# Patient Record
Sex: Male | Born: 1975 | Race: Black or African American | Hispanic: No | Marital: Single | Smoking: Never smoker
Health system: Southern US, Community
[De-identification: ages and names within clinical notes are randomized; demographics above are authoritative.]

---

## 2020-12-23 ENCOUNTER — Other Ambulatory Visit: Payer: Self-pay

## 2020-12-23 ENCOUNTER — Inpatient Hospital Stay
Admission: EM | Admit: 2020-12-23 | Discharge: 2020-12-30 | DRG: 638 | Disposition: A | Discharge status: Home / Self Care | Payer: BC Managed Care – PPO | Attending: Internal Medicine | Admitting: Internal Medicine

## 2020-12-23 ENCOUNTER — Emergency Department: Payer: BC Managed Care – PPO

## 2020-12-23 DIAGNOSIS — S0100XD Unspecified open wound of scalp, subsequent encounter: Secondary | ICD-10-CM

## 2020-12-23 DIAGNOSIS — K2210 Ulcer of esophagus without bleeding: Secondary | ICD-10-CM | POA: Diagnosis present

## 2020-12-23 DIAGNOSIS — K7581 Nonalcoholic steatohepatitis (NASH): Secondary | ICD-10-CM

## 2020-12-23 DIAGNOSIS — D75838 Other thrombocytosis: Secondary | ICD-10-CM | POA: Diagnosis present

## 2020-12-23 DIAGNOSIS — D509 Iron deficiency anemia, unspecified: Secondary | ICD-10-CM | POA: Diagnosis present

## 2020-12-23 DIAGNOSIS — K2980 Duodenitis without bleeding: Secondary | ICD-10-CM | POA: Diagnosis present

## 2020-12-23 DIAGNOSIS — K449 Diaphragmatic hernia without obstruction or gangrene: Secondary | ICD-10-CM | POA: Diagnosis present

## 2020-12-23 DIAGNOSIS — L02811 Cutaneous abscess of head [any part, except face]: Secondary | ICD-10-CM | POA: Diagnosis present

## 2020-12-23 DIAGNOSIS — E876 Hypokalemia: Secondary | ICD-10-CM | POA: Diagnosis present

## 2020-12-23 DIAGNOSIS — J449 Chronic obstructive pulmonary disease, unspecified: Secondary | ICD-10-CM | POA: Diagnosis present

## 2020-12-23 DIAGNOSIS — K2970 Gastritis, unspecified, without bleeding: Secondary | ICD-10-CM | POA: Diagnosis present

## 2020-12-23 DIAGNOSIS — R109 Unspecified abdominal pain: Secondary | ICD-10-CM

## 2020-12-23 DIAGNOSIS — R1011 Right upper quadrant pain: Secondary | ICD-10-CM | POA: Diagnosis present

## 2020-12-23 DIAGNOSIS — Z20822 Contact with and (suspected) exposure to covid-19: Secondary | ICD-10-CM | POA: Diagnosis present

## 2020-12-23 DIAGNOSIS — E872 Acidosis, unspecified: Secondary | ICD-10-CM

## 2020-12-23 DIAGNOSIS — E860 Dehydration: Secondary | ICD-10-CM | POA: Diagnosis present

## 2020-12-23 DIAGNOSIS — F17200 Nicotine dependence, unspecified, uncomplicated: Secondary | ICD-10-CM | POA: Diagnosis present

## 2020-12-23 DIAGNOSIS — E1110 Type 2 diabetes mellitus with ketoacidosis without coma: Principal | ICD-10-CM | POA: Diagnosis present

## 2020-12-23 DIAGNOSIS — R1310 Dysphagia, unspecified: Secondary | ICD-10-CM

## 2020-12-23 DIAGNOSIS — K224 Dyskinesia of esophagus: Secondary | ICD-10-CM | POA: Diagnosis present

## 2020-12-23 DIAGNOSIS — R079 Chest pain, unspecified: Secondary | ICD-10-CM

## 2020-12-23 DIAGNOSIS — E669 Obesity, unspecified: Secondary | ICD-10-CM | POA: Diagnosis present

## 2020-12-23 DIAGNOSIS — N179 Acute kidney failure, unspecified: Secondary | ICD-10-CM | POA: Diagnosis present

## 2020-12-23 DIAGNOSIS — Z6834 Body mass index (BMI) 34.0-34.9, adult: Secondary | ICD-10-CM

## 2020-12-23 DIAGNOSIS — J069 Acute upper respiratory infection, unspecified: Secondary | ICD-10-CM | POA: Diagnosis present

## 2020-12-23 DIAGNOSIS — K5900 Constipation, unspecified: Secondary | ICD-10-CM | POA: Diagnosis present

## 2020-12-23 DIAGNOSIS — E871 Hypo-osmolality and hyponatremia: Secondary | ICD-10-CM | POA: Diagnosis present

## 2020-12-23 DIAGNOSIS — Z79899 Other long term (current) drug therapy: Secondary | ICD-10-CM

## 2020-12-23 LAB — CBC
HCT: 42.3 % (ref 39.0–52.0)
Hemoglobin: 15.1 g/dL (ref 13.0–17.0)
MCH: 28.7 pg (ref 26.0–34.0)
MCHC: 35.7 g/dL (ref 30.0–36.0)
MCV: 80.3 fL (ref 80.0–100.0)
Platelets: 465 10*3/uL — ABNORMAL HIGH (ref 150–400)
RBC: 5.27 MIL/uL (ref 4.22–5.81)
RDW: 14.9 % (ref 11.5–15.5)
WBC: 13.3 10*3/uL — ABNORMAL HIGH (ref 4.0–10.5)
nRBC: 0 % (ref 0.0–0.2)

## 2020-12-23 LAB — LACTIC ACID, PLASMA: Lactic Acid, Venous: 2.8 mmol/L (ref 0.5–1.9)

## 2020-12-23 LAB — RESP PANEL BY RT-PCR (FLU A&B, COVID) ARPGX2
Influenza A by PCR: NEGATIVE
Influenza B by PCR: NEGATIVE
SARS Coronavirus 2 by RT PCR: NEGATIVE

## 2020-12-23 MED ORDER — SODIUM CHLORIDE 0.9 % IV BOLUS
1000.0000 mL | Freq: Once | INTRAVENOUS | Status: AC
  Administered 2020-12-23: 1000 mL via INTRAVENOUS

## 2020-12-23 MED ORDER — KETOROLAC TROMETHAMINE 30 MG/ML IJ SOLN
30.0000 mg | Freq: Once | INTRAMUSCULAR | Status: AC
  Administered 2020-12-23: 30 mg via INTRAVENOUS
  Filled 2020-12-23: qty 1

## 2020-12-23 MED ORDER — ONDANSETRON HCL 4 MG/2ML IJ SOLN
4.0000 mg | Freq: Once | INTRAMUSCULAR | Status: AC
  Administered 2020-12-23: 4 mg via INTRAVENOUS
  Filled 2020-12-23: qty 2

## 2020-12-23 NOTE — ED Provider Notes (Signed)
[facility] Emergency Department Provider Note  Time seen: 11:53 PM  I have reviewed the triage vital signs and the nursing notes.   HISTORY  Chief Complaint Shortness of Breath   HPI David Watts is a 45 y.o. male with no past medical history who presents to the emergency department for 1 week of feeling poorly.  According to the patient for the past 1 week he has been experiencing chest pain described as a burning sensation like heartburn/reflux but worse.  States he has been short of breath at times nauseated and did have vomiting earlier today.  States subjective fever but no measured temperature.  Denies any cough.  Denies abdominal pain but states constipation over the past 1 week.  Denies any dysuria.  States generalized fatigue and weakness.   No past medical history on file.  There are no problems to display for this patient.   Prior to Admission medications   Not on File    No Known Allergies  No family history on file.  Social History    Review of Systems Constitutional: Subjective fever.  Positive generalized fatigue/weakness Cardiovascular: Chest burning/discomfort intermittent x1 week Respiratory: Mild shortness of breath.  Negative for cough. Gastrointestinal: Negative for abdominal pain.  Positive for nausea vomiting.  Negative for diarrhea.  Positive for constipation. Genitourinary: Negative for urinary compaints Musculoskeletal: Negative for musculoskeletal complaints Neurological: Negative for headache All other ROS negative  ____________________________________________   PHYSICAL EXAM:  VITAL SIGNS: ED Triage Vitals  Enc Vitals Group     BP 12/23/20 2209 (!) 130/102     Pulse Rate 12/23/20 2209 90     Resp 12/23/20 2209 (!) 22     Temp 12/23/20 2209 98.4 F (36.9 C)     Temp src --      SpO2 12/23/20 2209 99 %     Weight 12/23/20 2210 250 lb (113.4 kg)     Height 12/23/20 2210 5\' 11"  (1.803 m)     Head  Circumference --      Peak Flow --      Pain Score 12/23/20 2210 6     Pain Loc --      Pain Edu? --      Excl. in GC? --    Constitutional: Alert and oriented. Well appearing and in no distress. Eyes: Normal exam ENT      Head: Normocephalic and atraumatic.      Mouth/Throat: Mucous membranes are moist. Cardiovascular: Normal rate, regular rhythm. Respiratory: Normal respiratory effort without tachypnea nor retractions. Breath sounds are clear, without wheeze rales or rhonchi on exam. Gastrointestinal: Soft and nontender. No distention.  Musculoskeletal: Nontender with normal range of motion in all extremities.  Neurologic:  Normal speech and language. No gross focal neurologic deficits  Skin:  Skin is warm, slightly pale. Psychiatric: Mood and affect are normal.   ____________________________________________    EKG  EKG viewed and interpreted by myself shows a sinus rhythm at 92 bpm with a narrow QRS, normal axis, normal intervals, nonspecific ST changes.  ____________________________________________    RADIOLOGY  Chest x-ray is negative for acute abnormality.  ____________________________________________   INITIAL IMPRESSION / ASSESSMENT AND PLAN / ED COURSE  Pertinent labs & imaging results that were available during my care of the patient were reviewed by me and considered in my medical decision making (see chart for details).   Patient presents to the emergency department for 1 week of multiple symptoms including fatigue subjective fever, shortness of  breath, chest pain/burning, nausea and constipation.  Differential is quite broad but would include infectious etiologies such as COVID, pneumonia, UTI, intra-abdominal infection although benign abdomen on exam, dehydration, ACS.  We will check labs including a COVID swab, lactic acid, troponin.  We will IV hydrate treat with Toradol Zofran and continue to closely monitor while awaiting results.  Patient agreeable to plan  of care.   Patient's labs have resulted showing hyperglycemia of 453 with pseudohyponatremia of 123.  Patient has acute kidney injury with creatinine 1.4 unable to calculate anion gap with a bicarb of less than 7 likely indicating new onset diabetes with diabetic ketoacidosis.  We will check a VBG start on insulin infusion.  Potassium is 3.2 we will order IV potassium.  We will continue with IV hydration.  Patient will require admission to the hospital service.  In speaking to the patient he states for the past several weeks he has been extremely thirsty and urinating very frequently again consistent with new onset diabetes.  David Watts was evaluated in Emergency Department on 12/23/2020 for the symptoms described in the history of present illness. He was evaluated in the context of the global COVID-19 pandemic, which necessitated consideration that the patient might be at risk for infection with the SARS-CoV-2 virus that causes COVID-19. Institutional protocols and algorithms that pertain to the evaluation of patients at risk for COVID-19 are in a state of rapid change based on information released by regulatory bodies including the CDC and federal and state organizations. These policies and algorithms were followed during the patient's care in the ED.  CRITICAL CARE Performed by: Minna Antis   Total critical care time: 30 minutes  Critical care time was exclusive of separately billable procedures and treating other patients.  Critical care was necessary to treat or prevent imminent or life-threatening deterioration.  Critical care was time spent personally by me on the following activities: development of treatment plan with patient and/or surrogate as well as nursing, discussions with consultants, evaluation of patient's response to treatment, examination of patient, obtaining history from patient or surrogate, ordering and performing treatments and interventions, ordering and review of  laboratory studies, ordering and review of radiographic studies, pulse oximetry and re-evaluation of patient's condition.  ____________________________________________   FINAL CLINICAL IMPRESSION(S) / ED DIAGNOSES  New onset diabetes Diabetic ketoacidosis   Minna Antis, MD 12/24/20 [phone]

## 2020-12-23 NOTE — ED Notes (Signed)
Water given with provider approval

## 2020-12-23 NOTE — ED Triage Notes (Addendum)
Pt states has been nauseated, shob, sore throiat and chest pain for most of this week. Pt states was seen at urgent care for same and had a negative covid test. Pt states has had fever. Pt states he feels weak and has had decreased po intake over last week. Pt with marked shob with any exertion.

## 2020-12-23 NOTE — ED Notes (Addendum)
Ex blue, type/screen sent. Pt and family report appears paler than normal, pt markedly weak with shob while speaking

## 2020-12-24 ENCOUNTER — Inpatient Hospital Stay: Payer: BC Managed Care – PPO

## 2020-12-24 ENCOUNTER — Encounter: Payer: Self-pay | Admitting: Internal Medicine

## 2020-12-24 DIAGNOSIS — L02811 Cutaneous abscess of head [any part, except face]: Secondary | ICD-10-CM | POA: Diagnosis present

## 2020-12-24 DIAGNOSIS — J449 Chronic obstructive pulmonary disease, unspecified: Secondary | ICD-10-CM | POA: Diagnosis present

## 2020-12-24 DIAGNOSIS — E872 Acidosis, unspecified: Secondary | ICD-10-CM

## 2020-12-24 DIAGNOSIS — K2970 Gastritis, unspecified, without bleeding: Secondary | ICD-10-CM | POA: Diagnosis present

## 2020-12-24 DIAGNOSIS — K2210 Ulcer of esophagus without bleeding: Secondary | ICD-10-CM | POA: Diagnosis present

## 2020-12-24 DIAGNOSIS — R1319 Other dysphagia: Secondary | ICD-10-CM | POA: Diagnosis not present

## 2020-12-24 DIAGNOSIS — J069 Acute upper respiratory infection, unspecified: Secondary | ICD-10-CM

## 2020-12-24 DIAGNOSIS — R1011 Right upper quadrant pain: Secondary | ICD-10-CM | POA: Diagnosis present

## 2020-12-24 DIAGNOSIS — E669 Obesity, unspecified: Secondary | ICD-10-CM | POA: Diagnosis present

## 2020-12-24 DIAGNOSIS — D509 Iron deficiency anemia, unspecified: Secondary | ICD-10-CM | POA: Diagnosis present

## 2020-12-24 DIAGNOSIS — K2980 Duodenitis without bleeding: Secondary | ICD-10-CM | POA: Diagnosis present

## 2020-12-24 DIAGNOSIS — N179 Acute kidney failure, unspecified: Secondary | ICD-10-CM

## 2020-12-24 DIAGNOSIS — D75838 Other thrombocytosis: Secondary | ICD-10-CM | POA: Diagnosis present

## 2020-12-24 DIAGNOSIS — K449 Diaphragmatic hernia without obstruction or gangrene: Secondary | ICD-10-CM | POA: Diagnosis present

## 2020-12-24 DIAGNOSIS — E871 Hypo-osmolality and hyponatremia: Secondary | ICD-10-CM | POA: Diagnosis present

## 2020-12-24 DIAGNOSIS — I5031 Acute diastolic (congestive) heart failure: Secondary | ICD-10-CM | POA: Diagnosis not present

## 2020-12-24 DIAGNOSIS — R933 Abnormal findings on diagnostic imaging of other parts of digestive tract: Secondary | ICD-10-CM | POA: Diagnosis not present

## 2020-12-24 DIAGNOSIS — K5900 Constipation, unspecified: Secondary | ICD-10-CM | POA: Diagnosis present

## 2020-12-24 DIAGNOSIS — Z79899 Other long term (current) drug therapy: Secondary | ICD-10-CM | POA: Diagnosis not present

## 2020-12-24 DIAGNOSIS — K7581 Nonalcoholic steatohepatitis (NASH): Secondary | ICD-10-CM | POA: Diagnosis present

## 2020-12-24 DIAGNOSIS — K224 Dyskinesia of esophagus: Secondary | ICD-10-CM | POA: Diagnosis present

## 2020-12-24 DIAGNOSIS — Z6834 Body mass index (BMI) 34.0-34.9, adult: Secondary | ICD-10-CM | POA: Diagnosis not present

## 2020-12-24 DIAGNOSIS — Z20822 Contact with and (suspected) exposure to covid-19: Secondary | ICD-10-CM | POA: Diagnosis present

## 2020-12-24 DIAGNOSIS — E876 Hypokalemia: Secondary | ICD-10-CM | POA: Diagnosis present

## 2020-12-24 DIAGNOSIS — E1110 Type 2 diabetes mellitus with ketoacidosis without coma: Principal | ICD-10-CM

## 2020-12-24 DIAGNOSIS — F17200 Nicotine dependence, unspecified, uncomplicated: Secondary | ICD-10-CM | POA: Diagnosis present

## 2020-12-24 DIAGNOSIS — E860 Dehydration: Secondary | ICD-10-CM | POA: Diagnosis present

## 2020-12-24 DIAGNOSIS — R1310 Dysphagia, unspecified: Secondary | ICD-10-CM | POA: Diagnosis not present

## 2020-12-24 LAB — BLOOD GAS, VENOUS
Acid-base deficit: 20.3 mmol/L — ABNORMAL HIGH (ref 0.0–2.0)
Bicarbonate: 7 mmol/L — ABNORMAL LOW (ref 20.0–28.0)
O2 Saturation: 53.5 %
Patient temperature: 37
pCO2, Ven: 21 mmHg — ABNORMAL LOW (ref 44.0–60.0)
pH, Ven: 7.13 — CL (ref 7.250–7.430)
pO2, Ven: 39 mmHg (ref 32.0–45.0)

## 2020-12-24 LAB — BASIC METABOLIC PANEL
Anion gap: 18 — ABNORMAL HIGH (ref 5–15)
Anion gap: 17 — ABNORMAL HIGH (ref 5–15)
Anion gap: 8 (ref 5–15)
Anion gap: 11 (ref 5–15)
Anion gap: 6 (ref 5–15)
Anion gap: 10 (ref 5–15)
BUN: 25 mg/dL — ABNORMAL HIGH (ref 6–20)
BUN: 24 mg/dL — ABNORMAL HIGH (ref 6–20)
BUN: 20 mg/dL (ref 6–20)
BUN: 21 mg/dL — ABNORMAL HIGH (ref 6–20)
BUN: 17 mg/dL (ref 6–20)
BUN: 14 mg/dL (ref 6–20)
CO2: 10 mmol/L — ABNORMAL LOW (ref 22–32)
CO2: 9 mmol/L — ABNORMAL LOW (ref 22–32)
CO2: 12 mmol/L — ABNORMAL LOW (ref 22–32)
CO2: 14 mmol/L — ABNORMAL LOW (ref 22–32)
CO2: 14 mmol/L — ABNORMAL LOW (ref 22–32)
CO2: 10 mmol/L — ABNORMAL LOW (ref 22–32)
Calcium: 10 mg/dL (ref 8.9–10.3)
Calcium: 9.8 mg/dL (ref 8.9–10.3)
Calcium: 9.4 mg/dL (ref 8.9–10.3)
Calcium: 9.6 mg/dL (ref 8.9–10.3)
Calcium: 9.7 mg/dL (ref 8.9–10.3)
Calcium: 9.5 mg/dL (ref 8.9–10.3)
Chloride: 97 mmol/L — ABNORMAL LOW (ref 98–111)
Chloride: 101 mmol/L (ref 98–111)
Chloride: 104 mmol/L (ref 98–111)
Chloride: 99 mmol/L (ref 98–111)
Chloride: 106 mmol/L (ref 98–111)
Chloride: 106 mmol/L (ref 98–111)
Creatinine, Ser: 1.58 mg/dL — ABNORMAL HIGH (ref 0.61–1.24)
Creatinine, Ser: 1.28 mg/dL — ABNORMAL HIGH (ref 0.61–1.24)
Creatinine, Ser: 1.24 mg/dL (ref 0.61–1.24)
Creatinine, Ser: 1.55 mg/dL — ABNORMAL HIGH (ref 0.61–1.24)
Creatinine, Ser: 1.25 mg/dL — ABNORMAL HIGH (ref 0.61–1.24)
Creatinine, Ser: 1.37 mg/dL — ABNORMAL HIGH (ref 0.61–1.24)
GFR, Estimated: 55 mL/min — ABNORMAL LOW (ref >60)
GFR, Estimated: >60 mL/min (ref >60)
GFR, Estimated: >60 mL/min (ref >60)
GFR, Estimated: 57 mL/min — ABNORMAL LOW (ref >60)
GFR, Estimated: >60 mL/min (ref >60)
GFR, Estimated: >60 mL/min (ref >60)
Glucose, Bld: 433 mg/dL — ABNORMAL HIGH (ref 70–99)
Glucose, Bld: 252 mg/dL — ABNORMAL HIGH (ref 70–99)
Glucose, Bld: 225 mg/dL — ABNORMAL HIGH (ref 70–99)
Glucose, Bld: 220 mg/dL — ABNORMAL HIGH (ref 70–99)
Glucose, Bld: 153 mg/dL — ABNORMAL HIGH (ref 70–99)
Glucose, Bld: 181 mg/dL — ABNORMAL HIGH (ref 70–99)
Potassium: 2.9 mmol/L — ABNORMAL LOW (ref 3.5–5.1)
Potassium: 2.3 mmol/L — CL (ref 3.5–5.1)
Potassium: 2.3 mmol/L — CL (ref 3.5–5.1)
Potassium: 2.9 mmol/L — ABNORMAL LOW (ref 3.5–5.1)
Potassium: 2.7 mmol/L — CL (ref 3.5–5.1)
Potassium: 3.1 mmol/L — ABNORMAL LOW (ref 3.5–5.1)
Sodium: 125 mmol/L — ABNORMAL LOW (ref 135–145)
Sodium: 127 mmol/L — ABNORMAL LOW (ref 135–145)
Sodium: 124 mmol/L — ABNORMAL LOW (ref 135–145)
Sodium: 124 mmol/L — ABNORMAL LOW (ref 135–145)
Sodium: 126 mmol/L — ABNORMAL LOW (ref 135–145)
Sodium: 126 mmol/L — ABNORMAL LOW (ref 135–145)

## 2020-12-24 LAB — COMPREHENSIVE METABOLIC PANEL
ALT: 24 U/L (ref 0–44)
AST: 30 U/L (ref 15–41)
Albumin: 2.7 g/dL — ABNORMAL LOW (ref 3.5–5.0)
Alkaline Phosphatase: 114 U/L (ref 38–126)
BUN: 22 mg/dL — ABNORMAL HIGH (ref 6–20)
CO2: <7 mmol/L — ABNORMAL LOW (ref 22–32)
Calcium: 9.8 mg/dL (ref 8.9–10.3)
Chloride: 97 mmol/L — ABNORMAL LOW (ref 98–111)
Creatinine, Ser: 1.4 mg/dL — ABNORMAL HIGH (ref 0.61–1.24)
GFR, Estimated: >60 mL/min (ref >60)
Glucose, Bld: 453 mg/dL — ABNORMAL HIGH (ref 70–99)
Potassium: 3.2 mmol/L — ABNORMAL LOW (ref 3.5–5.1)
Sodium: 123 mmol/L — ABNORMAL LOW (ref 135–145)
Total Bilirubin: 2.1 mg/dL — ABNORMAL HIGH (ref 0.3–1.2)
Total Protein: 7 g/dL (ref 6.5–8.1)

## 2020-12-24 LAB — URINALYSIS, COMPLETE (UACMP) WITH MICROSCOPIC
Bilirubin Urine: NEGATIVE
Glucose, UA: >=500 mg/dL — AB
Ketones, ur: 80 mg/dL — AB
Leukocytes,Ua: NEGATIVE
Nitrite: NEGATIVE
Protein, ur: 100 mg/dL — AB
Specific Gravity, Urine: 1.024 (ref 1.005–1.030)
pH: 6 (ref 5.0–8.0)

## 2020-12-24 LAB — GLUCOSE, CAPILLARY
Glucose-Capillary: 175 mg/dL — ABNORMAL HIGH (ref 70–99)
Glucose-Capillary: 178 mg/dL — ABNORMAL HIGH (ref 70–99)
Glucose-Capillary: 186 mg/dL — ABNORMAL HIGH (ref 70–99)
Glucose-Capillary: 187 mg/dL — ABNORMAL HIGH (ref 70–99)
Glucose-Capillary: 220 mg/dL — ABNORMAL HIGH (ref 70–99)

## 2020-12-24 LAB — CBG MONITORING, ED
Glucose-Capillary: 423 mg/dL — ABNORMAL HIGH (ref 70–99)
Glucose-Capillary: 389 mg/dL — ABNORMAL HIGH (ref 70–99)
Glucose-Capillary: 320 mg/dL — ABNORMAL HIGH (ref 70–99)
Glucose-Capillary: 255 mg/dL — ABNORMAL HIGH (ref 70–99)
Glucose-Capillary: 239 mg/dL — ABNORMAL HIGH (ref 70–99)
Glucose-Capillary: 284 mg/dL — ABNORMAL HIGH (ref 70–99)
Glucose-Capillary: 247 mg/dL — ABNORMAL HIGH (ref 70–99)
Glucose-Capillary: 233 mg/dL — ABNORMAL HIGH (ref 70–99)
Glucose-Capillary: 223 mg/dL — ABNORMAL HIGH (ref 70–99)
Glucose-Capillary: 177 mg/dL — ABNORMAL HIGH (ref 70–99)
Glucose-Capillary: 244 mg/dL — ABNORMAL HIGH (ref 70–99)
Glucose-Capillary: 216 mg/dL — ABNORMAL HIGH (ref 70–99)
Glucose-Capillary: 181 mg/dL — ABNORMAL HIGH (ref 70–99)
Glucose-Capillary: 151 mg/dL — ABNORMAL HIGH (ref 70–99)

## 2020-12-24 LAB — MAGNESIUM: Magnesium: 2 mg/dL (ref 1.7–2.4)

## 2020-12-24 LAB — TROPONIN I (HIGH SENSITIVITY)
Troponin I (High Sensitivity): 8 ng/L (ref <18)
Troponin I (High Sensitivity): 19 ng/L — ABNORMAL HIGH (ref <18)

## 2020-12-24 LAB — HEMOGLOBIN A1C
Hgb A1c MFr Bld: 14.2 % — ABNORMAL HIGH (ref 4.8–5.6)
Mean Plasma Glucose: 360.84 mg/dL

## 2020-12-24 LAB — BETA-HYDROXYBUTYRIC ACID
Beta-Hydroxybutyric Acid: 6.08 mmol/L — ABNORMAL HIGH (ref 0.05–0.27)
Beta-Hydroxybutyric Acid: 2.93 mmol/L — ABNORMAL HIGH (ref 0.05–0.27)
Beta-Hydroxybutyric Acid: 2.81 mmol/L — ABNORMAL HIGH (ref 0.05–0.27)

## 2020-12-24 LAB — LIPASE, BLOOD: Lipase: 199 U/L — ABNORMAL HIGH (ref 11–51)

## 2020-12-24 LAB — LACTIC ACID, PLASMA: Lactic Acid, Venous: 2.2 mmol/L (ref 0.5–1.9)

## 2020-12-24 LAB — MRSA NEXT GEN BY PCR, NASAL: MRSA by PCR Next Gen: NOT DETECTED

## 2020-12-24 LAB — HIV ANTIBODY (ROUTINE TESTING W REFLEX): HIV Screen 4th Generation wRfx: NONREACTIVE

## 2020-12-24 MED ORDER — PANTOPRAZOLE SODIUM 40 MG IV SOLR
40.0000 mg | INTRAVENOUS | Status: DC
  Administered 2020-12-24 – 2020-12-26 (×3): 40 mg via INTRAVENOUS
  Filled 2020-12-24 (×3): qty 40

## 2020-12-24 MED ORDER — DEXTROSE IN LACTATED RINGERS 5 % IV SOLN: INTRAVENOUS | Status: DC

## 2020-12-24 MED ORDER — POTASSIUM CHLORIDE 10 MEQ/100ML IV SOLN: 10.0000 meq | INTRAVENOUS | Status: DC

## 2020-12-24 MED ORDER — POTASSIUM CHLORIDE 10 MEQ/100ML IV SOLN
10.0000 meq | INTRAVENOUS | Status: AC
  Administered 2020-12-24 (×5): 10 meq via INTRAVENOUS
  Filled 2020-12-24 (×4): qty 100

## 2020-12-24 MED ORDER — POTASSIUM CHLORIDE 10 MEQ/100ML IV SOLN
10.0000 meq | INTRAVENOUS | Status: AC
  Administered 2020-12-24 (×4): 10 meq via INTRAVENOUS
  Filled 2020-12-24 (×4): qty 100

## 2020-12-24 MED ORDER — POTASSIUM CHLORIDE 10 MEQ/100ML IV SOLN
INTRAVENOUS | Status: AC
  Administered 2020-12-24: 10 meq via INTRAVENOUS
  Filled 2020-12-24: qty 100

## 2020-12-24 MED ORDER — ENOXAPARIN SODIUM 60 MG/0.6ML IJ SOSY
0.5000 mg/kg | PREFILLED_SYRINGE | INTRAMUSCULAR | Status: DC
  Administered 2020-12-24 – 2020-12-30 (×7): 57.5 mg via SUBCUTANEOUS
  Filled 2020-12-24: qty 0.6
  Filled 2020-12-24: qty 0.57
  Filled 2020-12-24 (×4): qty 0.6
  Filled 2020-12-24: qty 0.57

## 2020-12-24 MED ORDER — CHLORHEXIDINE GLUCONATE CLOTH 2 % EX PADS
6.0000 | MEDICATED_PAD | Freq: Every day | CUTANEOUS | Status: DC
  Administered 2020-12-24 – 2020-12-29 (×6): 6 via TOPICAL

## 2020-12-24 MED ORDER — POTASSIUM CHLORIDE 10 MEQ/100ML IV SOLN
10.0000 meq | INTRAVENOUS | Status: AC
  Administered 2020-12-24 (×2): 10 meq via INTRAVENOUS
  Filled 2020-12-24: qty 100

## 2020-12-24 MED ORDER — INSULIN STARTER KIT- PEN NEEDLES (ENGLISH)
1.0000 | Freq: Once | Status: AC
  Administered 2020-12-24: 1
  Filled 2020-12-24: qty 1

## 2020-12-24 MED ORDER — DEXTROSE 50 % IV SOLN: 0.0000 mL | INTRAVENOUS | Status: DC | PRN

## 2020-12-24 MED ORDER — OXYCODONE-ACETAMINOPHEN 5-325 MG PO TABS
1.0000 | ORAL_TABLET | ORAL | Status: DC | PRN
  Administered 2020-12-24 – 2020-12-25 (×4): 1 via ORAL
  Filled 2020-12-24 (×4): qty 1

## 2020-12-24 MED ORDER — PNEUMOCOCCAL VAC POLYVALENT 25 MCG/0.5ML IJ INJ
0.5000 mL | INJECTION | INTRAMUSCULAR | Status: DC
  Filled 2020-12-24: qty 0.5

## 2020-12-24 MED ORDER — LIVING WELL WITH DIABETES BOOK
Freq: Once | Status: AC
  Filled 2020-12-24: qty 1

## 2020-12-24 MED ORDER — DOXYCYCLINE HYCLATE 100 MG PO TABS
100.0000 mg | ORAL_TABLET | Freq: Two times a day (BID) | ORAL | Status: DC
  Administered 2020-12-24 – 2020-12-30 (×13): 100 mg via ORAL
  Filled 2020-12-24 (×13): qty 1

## 2020-12-24 MED ORDER — SODIUM CHLORIDE 0.9 % IV BOLUS
1000.0000 mL | Freq: Once | INTRAVENOUS | Status: AC
  Administered 2020-12-24: 1000 mL via INTRAVENOUS

## 2020-12-24 MED ORDER — INSULIN REGULAR(HUMAN) IN NACL 100-0.9 UT/100ML-% IV SOLN
INTRAVENOUS | Status: DC
  Filled 2020-12-24: qty 100

## 2020-12-24 MED ORDER — LACTATED RINGERS IV SOLN: INTRAVENOUS | Status: DC

## 2020-12-24 MED ORDER — INSULIN REGULAR(HUMAN) IN NACL 100-0.9 UT/100ML-% IV SOLN
INTRAVENOUS | Status: AC
Start: 2020-12-24 — End: 2020-12-26
  Administered 2020-12-24: 6.5 [IU]/h via INTRAVENOUS
  Administered 2020-12-24: 3.2 [IU]/h via INTRAVENOUS
  Administered 2020-12-25: 5.5 [IU]/h via INTRAVENOUS
  Administered 2020-12-26: 11 [IU]/h via INTRAVENOUS
  Filled 2020-12-24 (×3): qty 100

## 2020-12-24 MED ORDER — AMOXICILLIN-POT CLAVULANATE 875-125 MG PO TABS
1.0000 | ORAL_TABLET | Freq: Two times a day (BID) | ORAL | Status: DC
  Administered 2020-12-24 – 2020-12-30 (×13): 1 via ORAL
  Filled 2020-12-24 (×14): qty 1

## 2020-12-24 MED ORDER — LIDOCAINE-EPINEPHRINE 2 %-1:100000 IJ SOLN
20.0000 mL | Freq: Once | INTRAMUSCULAR | Status: AC
  Administered 2020-12-24: 20 mL via INTRADERMAL

## 2020-12-24 MED ORDER — KCL IN DEXTROSE-NACL 20-5-0.9 MEQ/L-%-% IV SOLN
INTRAVENOUS | Status: DC
  Filled 2020-12-24 (×4): qty 1000

## 2020-12-24 MED ORDER — POTASSIUM CHLORIDE IN NACL 20-0.9 MEQ/L-% IV SOLN
INTRAVENOUS | Status: DC
  Filled 2020-12-24 (×5): qty 1000

## 2020-12-24 NOTE — ED Notes (Signed)
Lab called with critical potassium of 2.3.  MD notified.

## 2020-12-24 NOTE — ED Notes (Signed)
Admitting MD at bedside.

## 2020-12-24 NOTE — ED Notes (Signed)
Pt lying in bed asleep. Pt vitals stable at this time with pt not presenting with any obvious distress. Pt awaiting next blood draw to assess plan of care.

## 2020-12-24 NOTE — Procedures (Addendum)
Procedure Note  Date: 12/24/20 1:44 PM  Preforming Provider: Lynden Oxford, PA-C  Procedure: Incisions and Drainage  Pre-Procedure Diagnosis: Infected Occiput Scalp Cyst   Post-Procedure Diagnosis: Same  Anesthesia: 7 ccs of 1% lidocaine with epinephrine  Findings: Purulent drainage which was Cx, Cyst wall   Details of Procedure:  All risks, benefits, and alternatives to above procedure(s) were discussed with the patient and informed consent was obtained. The patient's occiput was prepped and draped in standard sterile fashion. 7 ccs of 1% lidocaine with epinephrine with injected intradermally and adequate anesthesia achieved. Using an 11 blade scalpel, a approximately 2 cm elliptical incision was made over already draining areas of purulence. This was Cx. I was able to sharply excise a majority of the cyst wall however, this did appear to have already ruptured. Forceps were used to probe the wound and break up loculation. The wound was then irrigated with copious amount of NS and packed with 1/2 inch iodoform gauze. The patient tolerated this well without immediate complications. All sharps were accounted for and disposed of properly  Complications: None apparent  --  Lynden Oxford, PA-C [facility] 12/24/2020, 1:44 PM [phone] M-F: 7am - 4pm  I was immediately available for supervision.

## 2020-12-24 NOTE — Progress Notes (Signed)
Received report and assumed pt care. Patient currently on first K-Rider out of 4 ordered. Potassium reportedly 2.3. Insulin gtt already infusing at time of handoff report. Changed patient to NPO except meds and sips per Dr. Para March for antibiotics and pain management. No serial BMPs ordered. Order placed for 2200. Will continue to monitor.

## 2020-12-24 NOTE — H&P (Signed)
History and Physical    David Watts HEN:[id number] DOB: 02/11/1976 DOA: 12/23/2020  PCP: Pcp, No   Patient coming from: home  I have personally briefly reviewed patient's old medical records in [facility]  Chief Complaint: Generalized malaise  HPI: David Watts is a 45 y.o. male with No significant past medical history who presents to the ED with a 1 week history of generalized malaise, epigastric and chest pain as well as nonbloody nonbilious vomiting.  Denies cough or shortness of breath.  Endorses urinary frequency and thirst patient visited an urgent care earlier in the week where he tested negative for COVID.  ED course: Temperature 98.4, BP 130/102 with pulse of 90 and respirations 22 with O2 sat 99% on room air Blood work with blood glucose of 453, anion gap not calculable due to bicarb under 7.  Potassium 3.2, creatinine 1.4, sodium 123 Lactic acid 2.8, WBC 13,000 with normal hemoglobin.  Troponin of 8.  COVID and flu negative.  Beta hydroxybutyric acid and VBG pending  EKG, personally viewed and interpreted: Sinus at 92 with no acute ST-T wave changes  Imaging: Chest x-ray no active disease  Patient given an IV fluid bolus, started on potassium replacement and IV insulin.  Hospitalist consulted for admission.   Review of Systems: As per HPI otherwise all other systems on review of systems negative.    History reviewed. No pertinent past medical history.  History reviewed. No pertinent surgical history.   reports that he has never smoked. He has never used smokeless tobacco. He reports that he does not drink alcohol. No history on file for drug use.  No Known Allergies  History reviewed. No pertinent family history.    Prior to Admission medications   Not on File    Physical Exam: Vitals:   12/23/20 2219 12/23/20 2230 12/23/20 2300 12/23/20 2349  BP:  (!) 146/86 137/79 113/83  Pulse:  84 81 79  Resp: (!) 28 (!) 28 (!) 23 (!) 25  Temp:      SpO2:  99%  100% 99%  Weight:      Height:         Vitals:   12/23/20 2219 12/23/20 2230 12/23/20 2300 12/23/20 2349  BP:  (!) 146/86 137/79 113/83  Pulse:  84 81 79  Resp: (!) 28 (!) 28 (!) 23 (!) 25  Temp:      SpO2:  99% 100% 99%  Weight:      Height:          Constitutional: Ill-appearing, dehydrated, eyes sunken .oriented x 3 . Not in any apparent distress HEENT:      Head: Normocephalic and atraumatic.         Eyes: PERLA, EOMI, Conjunctivae are normal. Sclera is non-icteric.       Mouth/Throat: Mucous membranes are dry      Neck: Supple with no signs of meningismus. Cardiovascular: Regular rate and rhythm. No murmurs, gallops, or rubs. 2+ symmetrical distal pulses are present . No JVD. No LE edema Respiratory: Respiratory effort normal .Lungs sounds clear bilaterally. No wheezes, crackles, or rhonchi.  Gastrointestinal: Soft, non tender, and non distended with positive bowel sounds.  Genitourinary: No CVA tenderness. Musculoskeletal: Nontender with normal range of motion in all extremities. No cyanosis, or erythema of extremities. Neurologic:  Face is symmetric. Moving all extremities. No gross focal neurologic deficits . Skin: Skin is warm, dry.  No rash or ulcers Psychiatric: Mood and affect are normal    Labs  on Admission: I have personally reviewed following labs and imaging studies  CBC: Recent Labs  Lab 12/23/20 2251  WBC 13.3*  HGB 15.1  HCT 42.3  MCV 80.3  PLT 465*   Basic Metabolic Panel: Recent Labs  Lab 12/23/20 2251  NA 123*  K 3.2*  CL 97*  CO2 <7*  GLUCOSE 453*  BUN 22*  CREATININE 1.40*  CALCIUM 9.8   GFR: Estimated Creatinine Clearance: 87.1 mL/min (A) (by C-G formula based on SCr of 1.4 mg/dL (H)). Liver Function Tests: Recent Labs  Lab 12/23/20 2251  AST 30  ALT 24  ALKPHOS 114  BILITOT 2.1*  PROT 7.0  ALBUMIN 2.7*   No results for input(s): LIPASE, AMYLASE in the last 168 hours. No results for input(s): AMMONIA in the last 168  hours. Coagulation Profile: No results for input(s): INR, PROTIME in the last 168 hours. Cardiac Enzymes: No results for input(s): CKTOTAL, CKMB, CKMBINDEX, TROPONINI in the last 168 hours. BNP (last 3 results) No results for input(s): PROBNP in the last 8760 hours. HbA1C: No results for input(s): HGBA1C in the last 72 hours. CBG: No results for input(s): GLUCAP in the last 168 hours. Lipid Profile: No results for input(s): CHOL, HDL, LDLCALC, TRIG, CHOLHDL, LDLDIRECT in the last 72 hours. Thyroid Function Tests: No results for input(s): TSH, T4TOTAL, FREET4, T3FREE, THYROIDAB in the last 72 hours. Anemia Panel: No results for input(s): VITAMINB12, FOLATE, FERRITIN, TIBC, IRON, RETICCTPCT in the last 72 hours. Urine analysis: No results found for: COLORURINE, APPEARANCEUR, LABSPEC, PHURINE, GLUCOSEU, HGBUR, BILIRUBINUR, KETONESUR, PROTEINUR, UROBILINOGEN, NITRITE, LEUKOCYTESUR  Radiological Exams on Admission: DG Chest Port 1 View  Result Date: 12/23/2020 CLINICAL DATA:  Nausea short of breath EXAM: PORTABLE CHEST 1 VIEW COMPARISON:  None. FINDINGS: The heart size and mediastinal contours are within normal limits. Both lungs are clear. The visualized skeletal structures are unremarkable. IMPRESSION: No active disease. Electronically Signed   By: Jasmine Pang M.D.   On: 12/23/2020 22:46     Assessment/Plan 45 year old male with no significant past medical history presenting with a week of generalized vomiting, weakness, increased thirst and urinary frequency     DKA (diabetic ketoacidosis), new onset -Blood glucose 453 with anion gap not calculable due to bicarb in the 7 - Follow beta hydroxybutyric acid and VBG - Continue IV fluid resuscitation, insulin infusion and potassium supplementation per Endo tool - Troponin negative follow urinalysis, lipase - Diabetic educator and dietary consult    AKI (acute kidney injury) (HCC) - Likely related to DKA - Monitor for improvement with  IV fluids    URI (upper respiratory infection) - COVID-negative and chest x-ray clear - Treat symptoms    Lactic acidosis - Likely related to DKA.  Sepsis not suspected at this time.       DVT prophylaxis: Lovenox  Code Status: full code  Family Communication:  none  Disposition Plan: Back to previous home environment Consults called: none  Status:At the time of admission, it appears that the appropriate admission status for this patient is INPATIENT. This is judged to be reasonable and necessary in order to provide the required intensity of service to ensure the patient's safety given the presenting symptoms, physical exam findings, and initial radiographic and laboratory data in the context of their  Comorbid conditions.   Patient requires inpatient status due to high intensity of service, high risk for further deterioration and high frequency of surveillance required.   I certify that at the point of admission it  is my clinical judgment that the patient will require inpatient hospital care spanning beyond 2 midnights     Andris Baumann MD [facility]     12/24/2020, 1:43 AM

## 2020-12-24 NOTE — Consult Note (Addendum)
[facility] SURGICAL CONSULTATION NOTE (initial) - cpt: 90300   HISTORY OF PRESENT ILLNESS (HPI):  45 y.o. male presented to [facility] ED yesterday for evaluation of SOB. Patient had been reporting around a 1 week history of generalized fatigue, SOB, chest pain, and nausea. Ultimately found to have new onset T2DM with DKA and was admitted to the medicine service. Today, he was complaining of scalp pain. He reports that he has had an abscess near the occiput of his scalp for about 1 week. He reports that he got an Abx at an UC but was unsure the name. This did not help. He reports that it has continued to get worse and started draining what looks like pus. He does endorse subjective fevers at home. No history of similar.  Surgery is consulted by hospitalist physician Dr. Chipper Herb, MD in this context for evaluation and management of scalp abscess.  PAST MEDICAL HISTORY (PMH):  History reviewed. No pertinent past medical history.   PAST SURGICAL HISTORY (PSH):  History reviewed. No pertinent surgical history.   MEDICATIONS:  Prior to Admission medications   Medication Sig Start Date End Date Taking? Authorizing Provider  ibuprofen (ADVIL) 800 MG tablet Take 800 mg by mouth every 8 (eight) hours as needed.   Yes [provider]  cephALEXin (KEFLEX) 500 MG capsule Take 500 mg by mouth every 12 (twelve) hours. Patient not taking: Reported on 12/24/2020 12/18/20   [provider]  ondansetron (ZOFRAN-ODT) 4 MG disintegrating tablet Take by mouth. Patient not taking: Reported on 12/24/2020 12/18/20   [provider]     ALLERGIES:  No Known Allergies   SOCIAL HISTORY:  Social History   Socioeconomic History   Marital status: Single    Spouse name: Not on file   Number of children: Not on file   Years of education: Not on file   Highest education level: Not on file  Occupational History   Not on file  Tobacco Use   Smoking status: Never   Smokeless tobacco:  Never  Substance and Sexual Activity   Alcohol use: Never   Drug use: Not on file   Sexual activity: Not on file  Other Topics Concern   Not on file  Social History Narrative   Not on file   Social Determinants of Health   Financial Resource Strain: Not on file  Food Insecurity: Not on file  Transportation Needs: Not on file  Physical Activity: Not on file  Stress: Not on file  Social Connections: Not on file  Intimate Partner Violence: Not on file     FAMILY HISTORY:  History reviewed. No pertinent family history.    REVIEW OF SYSTEMS:  Review of Systems  Constitutional:  Positive for fever (Subjective) and malaise/fatigue. Negative for chills.  HENT:  Negative for congestion and sore throat.   Respiratory:  Negative for cough and shortness of breath.   Cardiovascular:  Negative for chest pain and palpitations.  Gastrointestinal:  Positive for nausea. Negative for abdominal pain, constipation, diarrhea and vomiting.  Genitourinary:  Positive for frequency. Negative for dysuria and urgency.  Endo/Heme/Allergies:  Positive for polydipsia.  All other systems reviewed and are negative.  VITAL SIGNS:  Temp:  [98.4 F (36.9 C)] 98.4 F (36.9 C) (08/07 2209) Pulse Rate:  [71-90] 71 (08/08 1410) Resp:  [16-28] 19 (08/08 1410) BP: (113-187)/(64-102) 121/70 (08/08 1410) SpO2:  [98 %-100 %] 99 % (08/08 1410) Weight:  [113.4 kg] 113.4 kg (08/07 2210)  Height: 5\' 11"  (180.3 cm) Weight: 113.4 kg BMI (Calculated): 34.88   INTAKE/OUTPUT:  08/07 0701 - 08/08 0700 In: -  Out: 1165 [Urine:1165]  PHYSICAL EXAM:  Physical Exam Vitals and nursing note reviewed.  Constitutional:      General: He is not in acute distress.    Appearance: He is well-developed. He is obese. He is not ill-appearing.  HENT:     Head: Normocephalic and atraumatic.      Mouth/Throat:     Mouth: Mucous membranes are moist.     Pharynx: Oropharynx is clear.  Eyes:     Extraocular Movements:  Extraocular movements intact.     Pupils: Pupils are equal, round, and reactive to light.  Cardiovascular:     Rate and Rhythm: Normal rate.     Pulses: Normal pulses.  Pulmonary:     Effort: Pulmonary effort is normal.     Breath sounds: Normal breath sounds. No decreased breath sounds.  Musculoskeletal:     Right lower leg: No edema.     Left lower leg: No edema.  Skin:    General: Skin is warm and dry.     Findings: Abscess (Scalp) present.  Neurological:     General: No focal deficit present.     Mental Status: He is alert and oriented to person, place, and time.  Psychiatric:        Mood and Affect: Mood normal.        Behavior: Behavior normal.    Scalp Abscess (12/24/2020):      Labs:  CBC Latest Ref Rng & Units 12/23/2020  WBC 4.0 - 10.5 K/uL 13.3(H)  Hemoglobin 13.0 - 17.0 g/dL 02/22/2021  Hematocrit 31.5 - 52.0 % 42.3  Platelets 150 - 400 K/uL 465(H)   CMP Latest Ref Rng & Units 12/24/2020 12/24/2020 12/24/2020  Glucose 70 - 99 mg/dL 02/23/2021) 160(V) 371(G)  BUN 6 - 20 mg/dL 20 626(R) 48(N)  Creatinine 0.61 - 1.24 mg/dL 46(E 7.03) 5.00(X)  Sodium 135 - 145 mmol/L 124(L) 127(L) 125(L)  Potassium 3.5 - 5.1 mmol/L 2.3(LL) 2.3(LL) 2.9(L)  Chloride 98 - 111 mmol/L 104 101 97(L)  CO2 22 - 32 mmol/L 12(L) 9(L) 10(L)  Calcium 8.9 - 10.3 mg/dL 9.4 9.8 3.81(W  Total Protein 6.5 - 8.1 g/dL - - -  Total Bilirubin 0.3 - 1.2 mg/dL - - -  Alkaline Phos 38 - 126 U/L - - -  AST 15 - 41 U/L - - -  ALT 0 - 44 U/L - - -     Imaging studies:  No new pertinent imaging studies   Assessment/Plan: (ICD-10's: L28.811) 45 y.o. male with what appears to be infected scalp abscess to the occiput, complicated by pertinent comorbidities including new onset T2DM with DKA.   - Will plan for bedside I&D which I will document separately - All risks, benefits, and alternatives to above procedure(s) were discussed with the patient, all of his questions were answered to his expressed satisfaction,  patient expresses he wishes to proceed, and informed consent was obtained.    - Continue Abx (Augmentin + Doxycycline); follow up Cx from ID   - Wound Care: Pack wound daily with iodoform gauze, cover and secure - Pain control prn   - Further management per primary service; we will follow   All of the above findings and recommendations were discussed with the patient, and all of patient's questions were answered to his expressed satisfaction.  Thank you for the opportunity to participate  in this patient's care.   -- Lynden Oxford, PA-C [facility] 12/24/2020, 2:26 PM [phone] M-F: 7am - 4pm  I agree with the above documentation and plan.

## 2020-12-24 NOTE — Progress Notes (Signed)
Anticoagulation monitoring(Lovenox):  45 yo male ordered Lovenox 57.5 mg Q24h    Filed Weights   12/23/20 2210  Weight: 113.4 kg (250 lb)   BMI  34.86  Lab Results  Component Value Date   CREATININE 1.40 (H) 12/23/2020   Estimated Creatinine Clearance: 87.1 mL/min (A) (by C-G formula based on SCr of 1.4 mg/dL (H)). Hemoglobin & Hematocrit     Component Value Date/Time   HGB 15.1 12/23/2020 2251   HCT 42.3 12/23/2020 2251     Per Protocol for Patient with estCrcl > 30 ml/min and BMI > 30, will transition to Lovenox 57.5 mg Q24h.

## 2020-12-24 NOTE — Progress Notes (Addendum)
PROGRESS NOTE    David Watts  HVF:[id number] DOB: 12/23/1975 DOA: 12/23/2020 PCP: Pcp, No   Chief complaint.  Generalized weakness. Brief Narrative:  David Watts is a 45 y.o. male with No significant past medical history who presents to the ED with a 1 week history of generalized malaise, epigastric and chest pain as well as nonbloody nonbilious vomiting.  Patient had a 1 week history of abdominal cramping pain, nausea vomiting, has not been able to eat.  No bowel movement for 1 week. He also is a chronic smoker, he has some shortness of breath. Arriving the hospital, patient was a found to have new onset diabetes with diabetes ketoacidosis.  He is placed on insulin drip.   Assessment & Plan:   Principal Problem:   DKA (diabetic ketoacidosis), new onset Active Problems:   URI (upper respiratory infection)   AKI (acute kidney injury) (HCC)   Lactic acidosis  #1.  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 Acute kidney injury secondary to diabetes ketoacidosis. Severe hypokalemia. Lactic acidosis secondary to dehydration Patient has a severe diabetes ketoacidosis.  Patient also was severely dehydrated.  At this point, I will continue IV fluids and the insulin drip. Patient has a large potassium deficit, potassium dropped down to 2.3 today, will give 50 mEq IV potassium, continue to follow.  Also check a magnesium level.  #2.  Hyponatremia.  Secondary to dehydration. Continue fluids,  3.  Constipation. Will start stool softener once patient started p.o.  4.  Scalp access. Patient has abscess in the posterior scalp, partially drained.  I will start antibiotics with Augmentin and doxycycline.  Consult general surgery for additional I&D.  DVT prophylaxis: Lovenox Code Status: full Family Communication:  Disposition Plan:    Status is: Inpatient  Remains inpatient appropriate because:IV treatments appropriate due to intensity of illness or inability to take PO and  Inpatient level of care appropriate due to severity of illness  Dispo: The patient is from: Home              Anticipated d/c is to: Home              Patient currently is not medically stable to d/c.   Difficult to place patient No        I/O last 3 completed shifts: In: -  Out: 1165 [Urine:1165] No intake/output data recorded.     Consultants:  None  Procedures: None  Antimicrobials: None  Subjective: Patient still feeling nauseated, no vomiting.  He is n.p.o. currently. Has some short of breath, was placed on 1 L oxygen for comfort. No fever or chills. Objective: Vitals:   12/24/20 0530 12/24/20 0600 12/24/20 0700 12/24/20 0900  BP: (!) 141/78 (!) 146/77 (!) 113/99 (!) 153/75  Pulse: 85 82 77 79  Resp: (!) 25 (!) 25 (!) 28 (!) 24  Temp:      SpO2: 99% 100% 99% 99%  Weight:      Height:        Intake/Output Summary (Last 24 hours) at 12/24/2020 1028 Last data filed at 12/24/2020 0558 Gross per 24 hour  Intake --  Output 1165 ml  Net -1165 ml   Filed Weights   12/23/20 2210  Weight: 113.4 kg    Examination:  General exam: Appears calm and comfortable  Respiratory system: Clear to auscultation. Respiratory effort normal. Cardiovascular system: S1 & S2 heard, RRR. No JVD, murmurs, rubs, gallops or clicks. No pedal edema. Gastrointestinal system: Abdomen is nondistended, soft  and nontender. No organomegaly or masses felt. Normal bowel sounds heard. Central nervous system: Alert and oriented. No focal neurological deficits. Extremities: Symmetric 5 x 5 power. Skin: No rashes, lesions or ulcers Psychiatry: Judgement and insight appear normal. Mood & affect appropriate.     Data Reviewed: I have personally reviewed following labs and imaging studies  CBC: Recent Labs  Lab 12/23/20 2251  WBC 13.3*  HGB 15.1  HCT 42.3  MCV 80.3  PLT 903*   Basic Metabolic Panel: Recent Labs  Lab 12/23/20 2251 12/24/20 0126 12/24/20 0522  NA 123* 125* 127*  K  3.2* 2.9* 2.3*  CL 97* 97* 101  CO2 <7* 10* 9*  GLUCOSE 453* 433* 252*  BUN 22* 25* 24*  CREATININE 1.40* 1.58* 1.28*  CALCIUM 9.8 10.0 9.8   GFR: Estimated Creatinine Clearance: 95.3 mL/min (A) (by C-G formula based on SCr of 1.28 mg/dL (H)). Liver Function Tests: Recent Labs  Lab 12/23/20 2251  AST 30  ALT 24  ALKPHOS 114  BILITOT 2.1*  PROT 7.0  ALBUMIN 2.7*   Recent Labs  Lab 12/24/20 0522  LIPASE 199*   No results for input(s): AMMONIA in the last 168 hours. Coagulation Profile: No results for input(s): INR, PROTIME in the last 168 hours. Cardiac Enzymes: No results for input(s): CKTOTAL, CKMB, CKMBINDEX, TROPONINI in the last 168 hours. BNP (last 3 results) No results for input(s): PROBNP in the last 8760 hours. HbA1C: No results for input(s): HGBA1C in the last 72 hours. CBG: Recent Labs  Lab 12/24/20 0521 12/24/20 0623 12/24/20 0714 12/24/20 0833 12/24/20 1001  GLUCAP 255* 239* 284* 247* 233*   Lipid Profile: No results for input(s): CHOL, HDL, LDLCALC, TRIG, CHOLHDL, LDLDIRECT in the last 72 hours. Thyroid Function Tests: No results for input(s): TSH, T4TOTAL, FREET4, T3FREE, THYROIDAB in the last 72 hours. Anemia Panel: No results for input(s): VITAMINB12, FOLATE, FERRITIN, TIBC, IRON, RETICCTPCT in the last 72 hours. Sepsis Labs: Recent Labs  Lab 12/23/20 2251 12/24/20 0126  LATICACIDVEN 2.8* 2.2*    Recent Results (from the past 240 hour(s))  Resp Panel by RT-PCR (Flu A&B, Covid) Nasopharyngeal Swab     Status: None   Collection Time: 12/23/20 10:51 PM   Specimen: Nasopharyngeal Swab; Nasopharyngeal(NP) swabs in vial transport medium  Result Value Ref Range Status   SARS Coronavirus 2 by RT PCR NEGATIVE NEGATIVE Final    Comment: (NOTE) SARS-CoV-2 target nucleic acids are NOT DETECTED.  The SARS-CoV-2 RNA is generally detectable in upper respiratory specimens during the acute phase of infection. The lowest concentration of SARS-CoV-2  viral copies this assay can detect is 138 copies/mL. A negative result does not preclude SARS-Cov-2 infection and should not be used as the sole basis for treatment or other patient management decisions. A negative result may occur with  improper specimen collection/handling, submission of specimen other than nasopharyngeal swab, presence of viral mutation(s) within the areas targeted by this assay, and inadequate number of viral copies(<138 copies/mL). A negative result must be combined with clinical observations, patient history, and epidemiological information. The expected result is Negative.  Fact Sheet for Patients:  EntrepreneurPulse.com.au  Fact Sheet for Healthcare Providers:  IncredibleEmployment.be  This test is no t yet approved or cleared by the [place] FDA and  has been authorized for detection and/or diagnosis of SARS-CoV-2 by FDA under an Emergency Use Authorization (EUA). This EUA will remain  in effect (meaning this test can be used) for the duration of the COVID-19 declaration  under Section 564(b)(1) of the Act, 21 U.S.C.section 360bbb-3(b)(1), unless the authorization is terminated  or revoked sooner.       Influenza A by PCR NEGATIVE NEGATIVE Final   Influenza B by PCR NEGATIVE NEGATIVE Final    Comment: (NOTE) The Xpert Xpress SARS-CoV-2/FLU/RSV plus assay is intended as an aid in the diagnosis of influenza from Nasopharyngeal swab specimens and should not be used as a sole basis for treatment. Nasal washings and aspirates are unacceptable for Xpert Xpress SARS-CoV-2/FLU/RSV testing.  Fact Sheet for Patients: EntrepreneurPulse.com.au  Fact Sheet for Healthcare Providers: IncredibleEmployment.be  This test is not yet approved or cleared by the [place] FDA and has been authorized for detection and/or diagnosis of SARS-CoV-2 by FDA under an Emergency Use Authorization  (EUA). This EUA will remain in effect (meaning this test can be used) for the duration of the COVID-19 declaration under Section 564(b)(1) of the Act, 21 U.S.C. section 360bbb-3(b)(1), unless the authorization is terminated or revoked.  Performed at [facility], [address], [place], [place] [address]          Radiology Studies: [facility] Chest Dawn 1 View  Result Date: 12/23/2020 CLINICAL DATA:  Nausea short of breath EXAM: PORTABLE CHEST 1 VIEW COMPARISON:  None. FINDINGS: The heart size and mediastinal contours are within normal limits. Both lungs are clear. The visualized skeletal structures are unremarkable. IMPRESSION: No active disease. Electronically Signed   By: Donavan Foil M.D.   On: 12/23/2020 22:46        Scheduled Meds:  enoxaparin (LOVENOX) injection  0.5 mg/kg Subcutaneous Q24H   insulin starter kit- pen needles  1 kit Other Once   living well with diabetes book   Does not apply Once   pantoprazole (PROTONIX) IV  40 mg Intravenous Q24H   Continuous Infusions:  dextrose 5% lactated ringers 125 mL/hr at 12/24/20 3007   insulin 5 Units/hr (12/24/20 1003)   lactated ringers Stopped (12/24/20 0627)   potassium chloride 10 mEq (12/24/20 1002)     LOS: 0 days    Time spent:     Sharen Hones, MD [facility]   To contact the attending provider between 7A-7P or the covering provider during after hours 7P-7A, please log into the web site [email or web address] and access using universal [place] password for that web site. If you do not have the password, please call the hospital operator.  12/24/2020, 10:28 AM

## 2020-12-24 NOTE — Progress Notes (Addendum)
Inpatient Diabetes Program Recommendations  AACE/ADA: New Consensus Statement on Inpatient Glycemic Control   Target Ranges:  Prepandial:   less than 140 mg/dL      Peak postprandial:   less than 180 mg/dL (1-2 hours)      Critically ill patients:  140 - 180 mg/dL  Results for JYAIR, KIRALY (MRN [id number]) as of 12/24/2020 08:17  Ref. Range 12/24/2020 01:44 12/24/2020 02:51 12/24/2020 03:55 12/24/2020 05:21 12/24/2020 06:23 12/24/2020 07:14  Glucose-Capillary Latest Ref Range: 70 - 99 mg/dL 423 (H) 389 (H) 320 (H) 255 (H) 239 (H) 284 (H)    Results for BENI, TURRELL (MRN [id number]) as of 12/24/2020 08:17  Ref. Range 12/23/2020 22:51  CO2 Latest Ref Range: 22 - 32 mmol/L <7 (L)  Glucose Latest Ref Range: 70 - 99 mg/dL 453 (H)  Anion gap Latest Ref Range: 5 - 15  NOT CALCULATED  Results for BRENNAN, LITZINGER (MRN [id number]) as of 12/24/2020 08:17  Ref. Range 12/24/2020 01:26  Beta-Hydroxybutyric Acid Latest Ref Range: 0.05 - 0.27 mmol/L 6.08 (H)   Review of Glycemic Control  Diabetes history: No Outpatient Diabetes medications: NA Current orders for Inpatient glycemic control: IV insulin  Inpatient Diabetes Program Recommendations:    Insulin: IV insulin infusion with sufficient glucose should be continued until acidosis is corrected as determined by MD (venous CO2 > 19 mmol/L, normal anion gap (8-12), negative ketones (beta-hydroxybutyric acid < 0.5 mmol/L)).  HbgA1C: Current A1C in process.  NOTE: Noted consult and chart reviewed. Patient admitted with DKA with initial glucose 453 mg/dl and started on IV insulin. Per chart, patient does not have any hx of DM. Per notes, patient reported nausea, sore throat, shortness of breath, and chest pain for most of week; patient also reported visit to urgent care last week. Patient is currently still in the Emergency Department on IV insulin. Per labs, acidosis has not cleared. IV insulin should be continued until acidosis has cleared completely. Ordered: Living Well  with DM book, insulin starter kit, RD consult for diet education, and patient education by bedside nursing.  Will plan to speak with patient today.  Addendum 12/24/20$RemoveBeforeD'@13'[id number]$ :00-Spoke with patient about new diabetes diagnosis. Patient reports that he still does not feel well (feeling weak and groggy) but notes nausea is better.  Patient states that he has insurance but no PCP. Patient reports that he does not recall when he last seen a primary care provider or had any blood work done. Consulted TOC to assist with arranging follow up to establish with a local PCP.  Patient reports family hx of DM in both parents, several uncles, and most of his siblings.  Patient notes that he went to urgent care last week with similar symptoms and was given medication for nausea and an antibiotic for a abscess he has on the left side of the back of his head (notes it has been draining).  Patient states that he has been having symptoms of hyperglycemia (increased urination, increased thirst, increased tiredness, weight loss (has lost 10-15 pounds over past week),and blurry vision for past 1-2 weeks and notes symptoms have gotten progressively worse. Discussed initial glucose of 453 mg/dl and A1C results (14.2% on 12/24/20) and explained what an A1C is and informed patient that his current A1C indicates an average glucose of 361 mg/dl over the past 2-3 months. Discussed basic pathophysiology of DM and DKA. Discussed hospital protocol for treatment of DKA and explained that once acidosis is completely cleared then he would be transitioned from IV  to SQ insulin. Explained that given his A1C is 14.2%, he will likely need to be discharged on insulin.  Informed patient that a Living Well with diabetes booklet and an insulin starter kit has been ordered; encouraged patient to read through entire book once received.  Informed patient that diabetes coordinator will plan to talk with him again tomorrow.  Patient verbalized understanding of  information discussed and he states that he has no further questions at this time related to diabetes.  RNs to provide ongoing basic DM education at bedside with this patient and engage patient to actively check blood glucose and administer insulin injections.   Thanks, Barnie Alderman, RN, MSN, CDE Diabetes Coordinator Inpatient Diabetes Program [phone] (Team Pager from 8am to 5pm)

## 2020-12-24 NOTE — Plan of Care (Signed)
  Problem: Education: Goal: Knowledge of General Education information will improve Description: Including pain rating scale, medication(s)/side effects and non-pharmacologic comfort measures Outcome: Progressing   Problem: Health Behavior/Discharge Planning: Goal: Ability to manage health-related needs will improve Outcome: Progressing   Problem: Clinical Measurements: Goal: Ability to maintain clinical measurements within normal limits will improve Outcome: Progressing Goal: Will remain free from infection Outcome: Progressing Goal: Diagnostic test results will improve Outcome: Progressing Goal: Cardiovascular complication will be avoided Outcome: Progressing   Problem: Activity: Goal: Risk for activity intolerance will decrease Outcome: Progressing   Problem: Nutrition: Goal: Adequate nutrition will be maintained Outcome: Progressing   Problem: Coping: Goal: Level of anxiety will decrease Outcome: Progressing   Problem: Pain Managment: Goal: General experience of comfort will improve Outcome: Progressing   Problem: Safety: Goal: Ability to remain free from injury will improve Outcome: Progressing   Problem: Skin Integrity: Goal: Risk for impaired skin integrity will decrease Outcome: Progressing   Problem: Education: Goal: Ability to describe self-care measures that may prevent or decrease complications (Diabetes Survival Skills Education) will improve Outcome: Progressing Goal: Individualized Educational Video(s) Outcome: Progressing   Problem: Coping: Goal: Ability to adjust to condition or change in health will improve Outcome: Progressing   Problem: Fluid Volume: Goal: Ability to maintain a balanced intake and output will improve Outcome: Progressing   Problem: Health Behavior/Discharge Planning: Goal: Ability to identify and utilize available resources and services will improve Outcome: Progressing Goal: Ability to manage health-related needs will  improve Outcome: Progressing   Problem: Metabolic: Goal: Ability to maintain appropriate glucose levels will improve Outcome: Progressing   Problem: Nutritional: Goal: Maintenance of adequate nutrition will improve Outcome: Progressing Goal: Progress toward achieving an optimal weight will improve Outcome: Progressing   Problem: Skin Integrity: Goal: Risk for impaired skin integrity will decrease Outcome: Progressing

## 2020-12-25 DIAGNOSIS — N179 Acute kidney failure, unspecified: Secondary | ICD-10-CM

## 2020-12-25 DIAGNOSIS — E872 Acidosis: Secondary | ICD-10-CM

## 2020-12-25 LAB — BASIC METABOLIC PANEL
Anion gap: 8 (ref 5–15)
Anion gap: 7 (ref 5–15)
Anion gap: 9 (ref 5–15)
Anion gap: 5 (ref 5–15)
Anion gap: 3 — ABNORMAL LOW (ref 5–15)
BUN: 14 mg/dL (ref 6–20)
BUN: 11 mg/dL (ref 6–20)
BUN: 12 mg/dL (ref 6–20)
BUN: 9 mg/dL (ref 6–20)
BUN: 9 mg/dL (ref 6–20)
CO2: 15 mmol/L — ABNORMAL LOW (ref 22–32)
CO2: 15 mmol/L — ABNORMAL LOW (ref 22–32)
CO2: 15 mmol/L — ABNORMAL LOW (ref 22–32)
CO2: 15 mmol/L — ABNORMAL LOW (ref 22–32)
CO2: 20 mmol/L — ABNORMAL LOW (ref 22–32)
Calcium: 9.9 mg/dL (ref 8.9–10.3)
Calcium: 10 mg/dL (ref 8.9–10.3)
Calcium: 10 mg/dL (ref 8.9–10.3)
Calcium: 9.9 mg/dL (ref 8.9–10.3)
Calcium: 10 mg/dL (ref 8.9–10.3)
Chloride: 106 mmol/L (ref 98–111)
Chloride: 107 mmol/L (ref 98–111)
Chloride: 106 mmol/L (ref 98–111)
Chloride: 110 mmol/L (ref 98–111)
Chloride: 108 mmol/L (ref 98–111)
Creatinine, Ser: 1.38 mg/dL — ABNORMAL HIGH (ref 0.61–1.24)
Creatinine, Ser: 1.37 mg/dL — ABNORMAL HIGH (ref 0.61–1.24)
Creatinine, Ser: 1.37 mg/dL — ABNORMAL HIGH (ref 0.61–1.24)
Creatinine, Ser: 1.23 mg/dL (ref 0.61–1.24)
Creatinine, Ser: 1.26 mg/dL — ABNORMAL HIGH (ref 0.61–1.24)
GFR, Estimated: >60 mL/min (ref >60)
GFR, Estimated: >60 mL/min (ref >60)
GFR, Estimated: >60 mL/min (ref >60)
GFR, Estimated: >60 mL/min (ref >60)
GFR, Estimated: >60 mL/min (ref >60)
Glucose, Bld: 185 mg/dL — ABNORMAL HIGH (ref 70–99)
Glucose, Bld: 166 mg/dL — ABNORMAL HIGH (ref 70–99)
Glucose, Bld: 154 mg/dL — ABNORMAL HIGH (ref 70–99)
Glucose, Bld: 211 mg/dL — ABNORMAL HIGH (ref 70–99)
Glucose, Bld: 179 mg/dL — ABNORMAL HIGH (ref 70–99)
Potassium: 2.7 mmol/L — CL (ref 3.5–5.1)
Potassium: 2.9 mmol/L — ABNORMAL LOW (ref 3.5–5.1)
Potassium: 2.8 mmol/L — ABNORMAL LOW (ref 3.5–5.1)
Potassium: 2.6 mmol/L — CL (ref 3.5–5.1)
Potassium: 2.7 mmol/L — CL (ref 3.5–5.1)
Sodium: 129 mmol/L — ABNORMAL LOW (ref 135–145)
Sodium: 129 mmol/L — ABNORMAL LOW (ref 135–145)
Sodium: 130 mmol/L — ABNORMAL LOW (ref 135–145)
Sodium: 130 mmol/L — ABNORMAL LOW (ref 135–145)
Sodium: 131 mmol/L — ABNORMAL LOW (ref 135–145)

## 2020-12-25 LAB — GLUCOSE, CAPILLARY
Glucose-Capillary: 163 mg/dL — ABNORMAL HIGH (ref 70–99)
Glucose-Capillary: 176 mg/dL — ABNORMAL HIGH (ref 70–99)
Glucose-Capillary: 189 mg/dL — ABNORMAL HIGH (ref 70–99)
Glucose-Capillary: 206 mg/dL — ABNORMAL HIGH (ref 70–99)
Glucose-Capillary: 183 mg/dL — ABNORMAL HIGH (ref 70–99)
Glucose-Capillary: 189 mg/dL — ABNORMAL HIGH (ref 70–99)
Glucose-Capillary: 183 mg/dL — ABNORMAL HIGH (ref 70–99)
Glucose-Capillary: 185 mg/dL — ABNORMAL HIGH (ref 70–99)
Glucose-Capillary: 177 mg/dL — ABNORMAL HIGH (ref 70–99)
Glucose-Capillary: 174 mg/dL — ABNORMAL HIGH (ref 70–99)
Glucose-Capillary: 169 mg/dL — ABNORMAL HIGH (ref 70–99)
Glucose-Capillary: 171 mg/dL — ABNORMAL HIGH (ref 70–99)
Glucose-Capillary: 178 mg/dL — ABNORMAL HIGH (ref 70–99)
Glucose-Capillary: 221 mg/dL — ABNORMAL HIGH (ref 70–99)
Glucose-Capillary: 232 mg/dL — ABNORMAL HIGH (ref 70–99)
Glucose-Capillary: 218 mg/dL — ABNORMAL HIGH (ref 70–99)
Glucose-Capillary: 209 mg/dL — ABNORMAL HIGH (ref 70–99)
Glucose-Capillary: 192 mg/dL — ABNORMAL HIGH (ref 70–99)
Glucose-Capillary: 159 mg/dL — ABNORMAL HIGH (ref 70–99)
Glucose-Capillary: 203 mg/dL — ABNORMAL HIGH (ref 70–99)
Glucose-Capillary: 187 mg/dL — ABNORMAL HIGH (ref 70–99)

## 2020-12-25 LAB — CBC WITH DIFFERENTIAL/PLATELET
Abs Immature Granulocytes: 0.58 10*3/uL — ABNORMAL HIGH (ref 0.00–0.07)
Basophils Absolute: 0.1 10*3/uL (ref 0.0–0.1)
Basophils Relative: 1 %
Eosinophils Absolute: 0 10*3/uL (ref 0.0–0.5)
Eosinophils Relative: 0 %
HCT: 33 % — ABNORMAL LOW (ref 39.0–52.0)
Hemoglobin: 12.1 g/dL — ABNORMAL LOW (ref 13.0–17.0)
Immature Granulocytes: 5 %
Lymphocytes Relative: 9 %
Lymphs Abs: 0.9 10*3/uL (ref 0.7–4.0)
MCH: 28.9 pg (ref 26.0–34.0)
MCHC: 36.7 g/dL — ABNORMAL HIGH (ref 30.0–36.0)
MCV: 78.8 fL — ABNORMAL LOW (ref 80.0–100.0)
Monocytes Absolute: 1.3 10*3/uL — ABNORMAL HIGH (ref 0.1–1.0)
Monocytes Relative: 13 %
Neutro Abs: 7.8 10*3/uL — ABNORMAL HIGH (ref 1.7–7.7)
Neutrophils Relative %: 72 %
Platelets: 433 10*3/uL — ABNORMAL HIGH (ref 150–400)
RBC: 4.19 MIL/uL — ABNORMAL LOW (ref 4.22–5.81)
RDW: 14.7 % (ref 11.5–15.5)
WBC: 10.7 10*3/uL — ABNORMAL HIGH (ref 4.0–10.5)
nRBC: 0.2 % (ref 0.0–0.2)

## 2020-12-25 LAB — BETA-HYDROXYBUTYRIC ACID
Beta-Hydroxybutyric Acid: 2.36 mmol/L — ABNORMAL HIGH (ref 0.05–0.27)
Beta-Hydroxybutyric Acid: 1.27 mmol/L — ABNORMAL HIGH (ref 0.05–0.27)

## 2020-12-25 LAB — MAGNESIUM: Magnesium: 2 mg/dL (ref 1.7–2.4)

## 2020-12-25 MED ORDER — SODIUM BICARBONATE 650 MG PO TABS
1300.0000 mg | ORAL_TABLET | Freq: Two times a day (BID) | ORAL | Status: DC
  Administered 2020-12-25 – 2020-12-28 (×6): 1300 mg via ORAL
  Filled 2020-12-25 (×8): qty 2

## 2020-12-25 MED ORDER — LIVING WELL WITH DIABETES BOOK
Freq: Once | Status: AC
  Filled 2020-12-25: qty 1

## 2020-12-25 MED ORDER — POTASSIUM CHLORIDE 10 MEQ/100ML IV SOLN
10.0000 meq | INTRAVENOUS | Status: AC
  Administered 2020-12-25 (×3): 10 meq via INTRAVENOUS
  Filled 2020-12-25 (×3): qty 100

## 2020-12-25 MED ORDER — POTASSIUM CHLORIDE 10 MEQ/100ML IV SOLN
10.0000 meq | INTRAVENOUS | Status: AC
  Administered 2020-12-25 (×4): 10 meq via INTRAVENOUS
  Filled 2020-12-25 (×4): qty 100

## 2020-12-25 MED ORDER — POTASSIUM CHLORIDE 10 MEQ/100ML IV SOLN
10.0000 meq | INTRAVENOUS | Status: AC
  Administered 2020-12-25 (×2): 10 meq via INTRAVENOUS
  Filled 2020-12-25 (×2): qty 100

## 2020-12-25 MED ORDER — SENNOSIDES-DOCUSATE SODIUM 8.6-50 MG PO TABS
1.0000 | ORAL_TABLET | Freq: Every evening | ORAL | Status: DC | PRN
  Administered 2020-12-25: 1 via ORAL
  Filled 2020-12-25: qty 1

## 2020-12-25 MED ORDER — INSULIN STARTER KIT- PEN NEEDLES (ENGLISH)
1.0000 | Freq: Once | Status: DC
  Filled 2020-12-25: qty 1

## 2020-12-25 MED ORDER — POTASSIUM CHLORIDE 2 MEQ/ML IV SOLN
INTRAVENOUS | Status: DC
  Filled 2020-12-25 (×4): qty 1000

## 2020-12-25 NOTE — Progress Notes (Addendum)
PROGRESS NOTE    David Watts  DQQ:[id number] DOB: 1975-11-28 DOA: 12/23/2020 PCP: Pcp, No   Follow-up on DKA. Brief Narrative:  David Watts is a 45 y.o. male with No significant past medical history who presents to the ED with a 1 week history of generalized malaise, epigastric and chest pain as well as nonbloody nonbilious vomiting.  Patient had a 1 week history of abdominal cramping pain, nausea vomiting, has not been able to eat.  No bowel movement for 1 week. He also is a chronic smoker, he has some shortness of breath. Arriving the hospital, patient was a found to have new onset diabetes with diabetes ketoacidosis.  He is placed on insulin drip.   Assessment & Plan:   Principal Problem:   DKA (diabetic ketoacidosis), new onset Active Problems:   URI (upper respiratory infection)   AKI (acute kidney injury) (HCC)   Lactic acidosis  #1.  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 Acute kidney injury secondary to diabetes ketoacidosis. Severe hypokalemia. Lactic acidosis secondary to dehydration Patient condition seem to be improving, he was continued on insulin drip overnight.  His anion gap has closed, but still has significant metabolic acidosis. I feel that some of the multiple acidosis could be due to nausea vomiting and dehydration rather than DKA.  I will recheck beta hydroxybutyrate acid, if this is close to normal, I will start sodium bicarbonate orally.  I will also transition to subcu insulin at that time. Patient has severe hypokalemia, he has been receiving massive amount of potassium infusion, still has persistent hypokalemia.  I will continue potassium IV bolus and 2 potassium has normalized.  Magnesium levels normal.  #2.  Hyponatremia This is secondary to dehydration. Patient receiving normal saline infusion. Sodium level gradually improving to  3.  Scalp abscess. Status post I&D per general surgery. Patient is not septic.  We will continue  Augmentin and doxycycline.  We will follow on the culture results from it.  4.  Constipation. Will start stool softener once patient able to take p.o.  #5.  Steatohepatitis. Right upper quadrant ultrasound did not show any gallstone.  #6.  Thrombocytosis. Secondary to DKA.  Will follow.  1500. Reviewed lab, bicarb still low. Will order sodium bicarb. Start diet. But continue insulin drip. Recheck bmp at 8pm.   DVT prophylaxis: Lovenox Code Status: full Family Communication:  Disposition Plan:    Status is: Inpatient  Remains inpatient appropriate because:Persistent severe electrolyte disturbances, IV treatments appropriate due to intensity of illness or inability to take PO, and Inpatient level of care appropriate due to severity of illness  Dispo: The patient is from: Home              Anticipated d/c is to: Home              Patient currently is not medically stable to d/c.   Difficult to place patient No        I/O last 3 completed shifts: In: 3037 [I.V.:1808.3; IV Piggyback:1228.7] Out: 3285 [Urine:3285] No intake/output data recorded.     Consultants:  General surgery  Procedures: I&D  Antimicrobials: Augmentin and doxycycline.  Subjective: Patient condition is improving.  He started feeling hungry, no nausea vomiting.  No additional abdominal pain today Still has no bowel movement. Denies any short of breath or cough, No fever chills pain No dysuria hematuria   Objective: Vitals:   12/25/20 0500 12/25/20 0600 12/25/20 0700 12/25/20 0800  BP: 140/77 126/70 121/65 134/83  Pulse: 87 93 87 91  Resp: (!) 45 (!) 31 17 18   Temp: 97.8 F (36.6 C)   98.2 F (36.8 C)  TempSrc: Oral     SpO2: 99% 98% 97% 97%  Weight:      Height:        Intake/Output Summary (Last 24 hours) at 12/25/2020 0936 Last data filed at 12/25/2020 0600 Gross per 24 hour  Intake 3036.99 ml  Output 2120 ml  Net 916.99 ml   Filed Weights   12/23/20 2210  Weight: 113.4 kg     Examination:  General exam: Appears calm and comfortable  Respiratory system: Clear to auscultation. Respiratory effort normal. Cardiovascular system: S1 & S2 heard, RRR. No JVD, murmurs, rubs, gallops or clicks. No pedal edema. Gastrointestinal system: Abdomen is nondistended, soft and nontender. No organomegaly or masses felt. Normal bowel sounds heard. Central nervous system: Alert and oriented. No focal neurological deficits. Extremities: Symmetric 5 x 5 power. Skin: No rashes, lesions or ulcers Psychiatry: Judgement and insight appear normal. Mood & affect appropriate.     Data Reviewed: I have personally reviewed following labs and imaging studies  CBC: Recent Labs  Lab 12/23/20 2251 12/25/20 0437  WBC 13.3* 10.7*  NEUTROABS  --  7.8*  HGB 15.1 12.1*  HCT 42.3 33.0*  MCV 80.3 78.8*  PLT 465* 433*   Basic Metabolic Panel: Recent Labs  Lab 12/24/20 1029 12/24/20 1421 12/24/20 1707 12/24/20 2316 12/25/20 0437  NA 124* 124* 126* 126* 129*  K 2.3* 2.9* 2.7* 3.1* 2.7*  CL 104 99 106 106 106  CO2 12* 14* 14* 10* 15*  GLUCOSE 225* 220* 153* 181* 185*  BUN 20 21* 17 14 14   CREATININE 1.24 1.55* 1.25* 1.37* 1.38*  CALCIUM 9.4 9.6 9.7 9.5 9.9  MG 2.0  --   --   --  2.0   GFR: Estimated Creatinine Clearance: 88.4 mL/min (A) (by C-G formula based on SCr of 1.38 mg/dL (H)). Liver Function Tests: Recent Labs  Lab 12/23/20 2251  AST 30  ALT 24  ALKPHOS 114  BILITOT 2.1*  PROT 7.0  ALBUMIN 2.7*   Recent Labs  Lab 12/24/20 0522  LIPASE 199*   No results for input(s): AMMONIA in the last 168 hours. Coagulation Profile: No results for input(s): INR, PROTIME in the last 168 hours. Cardiac Enzymes: No results for input(s): CKTOTAL, CKMB, CKMBINDEX, TROPONINI in the last 168 hours. BNP (last 3 results) No results for input(s): PROBNP in the last 8760 hours. HbA1C: Recent Labs    12/24/20 0522  HGBA1C 14.2*   CBG: Recent Labs  Lab 12/25/20 0503  12/25/20 0555 12/25/20 0702 12/25/20 0758 12/25/20 0859  GLUCAP 189* 183* 185* 177* 174*   Lipid Profile: No results for input(s): CHOL, HDL, LDLCALC, TRIG, CHOLHDL, LDLDIRECT in the last 72 hours. Thyroid Function Tests: No results for input(s): TSH, T4TOTAL, FREET4, T3FREE, THYROIDAB in the last 72 hours. Anemia Panel: No results for input(s): VITAMINB12, FOLATE, FERRITIN, TIBC, IRON, RETICCTPCT in the last 72 hours. Sepsis Labs: Recent Labs  Lab 12/23/20 2251 12/24/20 0126  LATICACIDVEN 2.8* 2.2*    Recent Results (from the past 240 hour(s))  Resp Panel by RT-PCR (Flu A&B, Covid) Nasopharyngeal Swab     Status: None   Collection Time: 12/23/20 10:51 PM   Specimen: Nasopharyngeal Swab; Nasopharyngeal(NP) swabs in vial transport medium  Result Value Ref Range Status   SARS Coronavirus 2 by RT PCR NEGATIVE NEGATIVE Final    Comment: (NOTE)  SARS-CoV-2 target nucleic acids are NOT DETECTED.  The SARS-CoV-2 RNA is generally detectable in upper respiratory specimens during the acute phase of infection. The lowest concentration of SARS-CoV-2 viral copies this assay can detect is 138 copies/mL. A negative result does not preclude SARS-Cov-2 infection and should not be used as the sole basis for treatment or other patient management decisions. A negative result may occur with  improper specimen collection/handling, submission of specimen other than nasopharyngeal swab, presence of viral mutation(s) within the areas targeted by this assay, and inadequate number of viral copies(<138 copies/mL). A negative result must be combined with clinical observations, patient history, and epidemiological information. The expected result is Negative.  Fact Sheet for Patients:  BloggerCourse.com  Fact Sheet for Healthcare Providers:  SeriousBroker.it  This test is no t yet approved or cleared by the [place] FDA and  has been authorized  for detection and/or diagnosis of SARS-CoV-2 by FDA under an Emergency Use Authorization (EUA). This EUA will remain  in effect (meaning this test can be used) for the duration of the COVID-19 declaration under Section 564(b)(1) of the Act, 21 U.S.C.section 360bbb-3(b)(1), unless the authorization is terminated  or revoked sooner.       Influenza A by PCR NEGATIVE NEGATIVE Final   Influenza B by PCR NEGATIVE NEGATIVE Final    Comment: (NOTE) The Xpert Xpress SARS-CoV-2/FLU/RSV plus assay is intended as an aid in the diagnosis of influenza from Nasopharyngeal swab specimens and should not be used as a sole basis for treatment. Nasal washings and aspirates are unacceptable for Xpert Xpress SARS-CoV-2/FLU/RSV testing.  Fact Sheet for Patients: BloggerCourse.com  Fact Sheet for Healthcare Providers: SeriousBroker.it  This test is not yet approved or cleared by the [place] FDA and has been authorized for detection and/or diagnosis of SARS-CoV-2 by FDA under an Emergency Use Authorization (EUA). This EUA will remain in effect (meaning this test can be used) for the duration of the COVID-19 declaration under Section 564(b)(1) of the Act, 21 U.S.C. section 360bbb-3(b)(1), unless the authorization is terminated or revoked.  Performed at [facility], [address] [place], [place] [address]   Aerobic/Anaerobic Culture w Gram Stain (surgical/deep wound)     Status: None (Preliminary result)   Collection Time: 12/24/20  2:21 PM   Specimen: SCALP; Abscess  Result Value Ref Range Status   Specimen Description   Final    SCALP Performed at [facility], [address], [place], [place] [address]    Special Requests   Final    NONE Performed at [facility], [address] [place], [place] [address]    Gram Stain   Final    MODERATE WBC PRESENT, PREDOMINANTLY PMN ABUNDANT GRAM POSITIVE COCCI     Culture   Final    CULTURE REINCUBATED FOR BETTER GROWTH Performed at [facility], [address], [place], [place] [address]    Report Status PENDING  Incomplete  MRSA Next Gen by PCR, Nasal     Status: None   Collection Time: 12/24/20  6:58 PM   Specimen: Nasal Mucosa; Nasal Swab  Result Value Ref Range Status   MRSA by PCR Next Gen NOT DETECTED NOT DETECTED Final    Comment: (NOTE) The GeneXpert MRSA Assay (FDA approved for NASAL specimens only), is one component of a comprehensive MRSA colonization surveillance program. It is not intended to diagnose MRSA infection nor to guide or monitor treatment for MRSA infections. Test performance is not FDA  approved in patients less than 61 years old. Performed at [facility], [address] [place], [place] [address]          Radiology Studies: [facility] Chest Guin 1 View  Result Date: 12/23/2020 CLINICAL DATA:  Nausea short of breath EXAM: PORTABLE CHEST 1 VIEW COMPARISON:  None. FINDINGS: The heart size and mediastinal contours are within normal limits. Both lungs are clear. The visualized skeletal structures are unremarkable. IMPRESSION: No active disease. Electronically Signed   By: Jasmine Pang M.D.   On: 12/23/2020 22:46   US Abdomen Limited RUQ (LIVER/GB)  Result Date: 12/24/2020 CLINICAL DATA:  45 year old male with right upper quadrant pain, nausea and vomiting for 1 week. Abnormal LFTs. EXAM: ULTRASOUND ABDOMEN LIMITED RIGHT UPPER QUADRANT COMPARISON:  None. FINDINGS: Gallbladder: No gallstones or wall thickening visualized. No sonographic Murphy sign noted by sonographer. Common bile duct: Diameter: 3 mm, normal. Liver: Mildly echogenic liver (image 40). No discrete liver lesion. No intrahepatic biliary ductal dilatation. Portal vein is patent on color Doppler imaging with normal direction of blood flow towards the liver. Other: Negative visible right kidney. IMPRESSION: Evidence of hepatic steatosis, otherwise  negative right upper quadrant ultrasound. Electronically Signed   By: Odessa Fleming M.D.   On: 12/24/2020 11:15        Scheduled Meds:  amoxicillin-clavulanate  1 tablet Oral Q12H   Chlorhexidine Gluconate Cloth  6 each Topical Daily   doxycycline  100 mg Oral Q12H   enoxaparin (LOVENOX) injection  0.5 mg/kg Subcutaneous Q24H   pantoprazole (PROTONIX) IV  40 mg Intravenous Q24H   pneumococcal 23 valent vaccine  0.5 mL Intramuscular Tomorrow-1000   Continuous Infusions:  0.9 % NaCl with KCl 20 mEq / L Stopped (12/24/20 1857)   dextrose 5 % and 0.9 % NaCl with KCl 20 mEq/L 100 mL/hr at 12/25/20 0600   insulin 5.5 Units/hr (12/25/20 0600)   potassium chloride 10 mEq (12/25/20 0903)     LOS: 1 day    Time spent: 36 minutes    Marrion Coy, MD [facility]   To contact the attending provider between 7A-7P or the covering provider during after hours 7P-7A, please log into the web site [email or web address] and access using universal [place] password for that web site. If you do not have the password, please call the hospital operator.  12/25/2020, 9:36 AM

## 2020-12-25 NOTE — Progress Notes (Signed)
Patient does not have serial BMPs ordered q 4H. Dr. Para March made aware. Results from previous lab draw conveyed to physician. Patient K+ remains 3.1 after 4 K-Riders. Awaiting further orders for DKA management.

## 2020-12-25 NOTE — Progress Notes (Addendum)
Inpatient Diabetes Program Recommendations  AACE/ADA: New Consensus Statement on Inpatient Glycemic Control   Target Ranges:  Prepandial:   less than 140 mg/dL      Peak postprandial:   less than 180 mg/dL (1-2 hours)      Critically ill patients:  140 - 180 mg/dL   Results for Carollo, Donzel (MRN [id number]) as of 12/25/2020 09:59  Ref. Range 12/24/2020 23:45 12/25/2020 01:11 12/25/2020 02:02 12/25/2020 03:11 12/25/2020 03:57 12/25/2020 05:03 12/25/2020 05:55 12/25/2020 07:02 12/25/2020 07:58 12/25/2020 08:59  Glucose-Capillary Latest Ref Range: 70 - 99 mg/dL 178 (H) 176 (H) 189 (H) 206 (H) 183 (H) 189 (H) 183 (H) 185 (H) 177 (H) 174 (H)   Results for Grandberry, Chayse (MRN [id number]) as of 12/25/2020 09:59  Ref. Range 12/25/2020 04:37  CO2 Latest Ref Range: 22 - 32 mmol/L 15 (L)  Glucose Latest Ref Range: 70 - 99 mg/dL 185 (H)  Anion gap Latest Ref Range: 5 - 15  8  Results for Eicher, Keene (MRN [id number]) as of 12/25/2020 09:59  Ref. Range 12/24/2020 05:22  Lipase Latest Ref Range: 11 - 51 U/L 199 (H)  Results for Blinder, Fedor (MRN [id number]) as of 12/25/2020 09:59  Ref. Range 12/24/2020 05:22  Hemoglobin A1C Latest Ref Range: 4.8 - 5.6 % 14.2 (H)   Review of Glycemic Control  Diabetes history: No Outpatient Diabetes medications: NA Current orders for Inpatient glycemic control: IV insulin  Inpatient Diabetes Program Recommendations:    Insulin: Patient remains on IV insulin and insulin needs are high (5-5.5 units/hour for past 4 hours). Dr. Zhang's note today indicates plan to continue IV insulin at this time.    Labs: Noted Lipase 199 U/L on 12/24/20; question if patient has pancreatitis.  NOTE: Will plan to follow up with patient again today regarding new DM dx.  Addendum 12/25/20@14:25-Met with patient again at bedside. Patient has 2 sisters at bedside from out of state. Charlene (sister from [place]) states that she would like to be included in plan of care and follow up. She asked about getting patient set up  with a local PCP and Endocrinologist. Explained that TOC is consulted and they should be seeing patient to help with arranging follow up care.  Patient states that he is still hurting in upper right quadrant of abdomen and he is no longer feeling nauseous. Patient states he would like to eat and states he thinks he would feel better if he could eat something. Informed patient that I would like attending provider know he would like to see if he can try to eat something. Patient states that he does not remember a lot of what we discussed yesterday regarding new DM dx. Patient states he was given Living Well with DM book in ED yesterday but he does not currently have the book (maybe left in ED). Informed patient that another Living Well with DM book and an insulin starter kit would be ordered again. Patient's vision is still very blurry so encouraged his sisters to review the information with the patient and encouraged patient to read through the book when he felt better and could see better.  Discussed A1C results (14.2%) and explained what an A1C is and informed patient that his current A1C indicates an average glucose of 361 mg/dl over the past 2-3 months. Discussed basic pathophysiology of DM Type 1 and 2, basic home care, importance of checking CBGs and maintaining good CBG control to prevent long-term and short-term complications. Reviewed glucose and A1C goals. Reviewed   signs and symptoms of hyperglycemia and hypoglycemia along with treatment for both. Discussed impact of nutrition, exercise, stress, sickness, and medications on diabetes control. Discussed glucose monitoring and that he will likely be discharged on insulin. Patient's sister asked about patient using a FreeStyle Libre (CGM). Discussed FreeStyle Libre in more detail and how it works. Explained that our team has some samples of the FreeStyle Libre2 sensors; we could ask attending provider if they will provide orders for us to give patient sensor  and assist in applying the initial sensor prior to being discharged. Explained how the doctor he follows up with can use glucose information to continue to make adjustments with DM medications if needed. Reviewed and demonstrated how to draw up and administer insulin with vial and syringe and how to use an insulin pen. Patient states that he thinks he would prefer to use the insulin pens. Patient stated he did not feel up to demonstrating how to use the insulin pen today but he would in the next day or so when he felt better.  Informed patient that once he is transitioned off IV insulin to SQ insulin,  RN will be asking him to self-administer insulin to ensure proper technique and ability to administer self insulin shots.   Patient verbalized understanding of information discussed and he states that he has no further questions at this time related to diabetes.   RNs to provide ongoing basic DM education at bedside with this patient and engage patient to actively check blood glucose and administer insulin injections (once ordered SQ insulin).  Will plan to follow up with patient again tomorrow.   Thanks, Marie Byrd, RN, MSN, CDE Diabetes Coordinator Inpatient Diabetes Program [phone] (Team Pager from 8am to 5pm)   

## 2020-12-25 NOTE — Progress Notes (Signed)
Critical K+ 2.7 reported to Dr. Para March. Awaiting further orders.

## 2020-12-25 NOTE — Progress Notes (Signed)
Pt currently complaining of severe right upper quad pain. PRN oxy given as ordered. Patient hyperventilating at time of assessment. Dr Para March made aware. Awaiting further orders. Dressing change deferred until patient appears more comfortable.

## 2020-12-25 NOTE — Progress Notes (Signed)
Initial Nutrition Assessment  DOCUMENTATION CODES:   Obesity unspecified  INTERVENTION:   RD will add supplements once pt's diet is advanced  RD will provide diabetes diet education prior to discharge  Pt at high refeed risk; recommend monitor potassium, magnesium and phosphorus labs daily until stable  NUTRITION DIAGNOSIS:   Inadequate oral intake related to acute illness as evidenced by NPO status.  GOAL:   Patient will meet greater than or equal to 90% of their needs  MONITOR:   Diet advancement, Labs, Weight trends, Skin, I & O's  REASON FOR ASSESSMENT:   Consult Diet education  ASSESSMENT:   45 y/o male admitted with new DM, DKA, AKI, URI and scalp cyst now s/p I & D 8/8  Met with pt in room today. Pt sleepy as he did not get rest overnight secondary to every one hour glucose sticks. Pt remains NPO in hospital. There is no documented weight history in chart to determine if any significant recent weight changes. RD will follow patient and add supplements once pt's diet is advanced. Pt is currently refeeding. RD will obtain nutrition related history and provide diabetes diet education at follow-up.   Medications reviewed and include: Augmentin, doxycycline, lovenox, protonix, insulin, KCl  Labs reviewed: Na 129(L), K 2.9(L), creat 1.37(H) Wbc- 10.7(H), Hgb 12.1(L), Hct 33.0(L), MCV 78.8(L) Cbgs- 185, 177, 174, 169, 171 x 24 hrs AIC 14.2 8/8  NUTRITION - FOCUSED PHYSICAL EXAM:  Flowsheet Row Most Recent Value  Orbital Region No depletion  Upper Arm Region No depletion  Thoracic and Lumbar Region No depletion  Buccal Region No depletion  Temple Region No depletion  Clavicle Bone Region No depletion  Clavicle and Acromion Bone Region No depletion  Scapular Bone Region No depletion  Dorsal Hand No depletion  Patellar Region No depletion  Anterior Thigh Region No depletion  Posterior Calf Region No depletion  Edema (RD Assessment) Mild  Hair Reviewed  Eyes  Reviewed  Mouth Reviewed  Skin Reviewed  Nails Reviewed   Diet Order:   Diet Order             Diet NPO time specified Except for: Sips with Meds  Diet effective now                  EDUCATION NEEDS:   Not appropriate for education at this time  Skin:  Skin Assessment: Reviewed RN Assessment (incision head)  Last BM:  PTA  Height:   Ht Readings from Last 1 Encounters:  12/23/20 _0  (1.803 m)    Weight:   Wt Readings from Last 1 Encounters:  12/23/20 113.4 kg    Ideal Body Weight:  78.18 kg  BMI:  Body mass index is 34.87 kg/m.  Estimated Nutritional Needs:   Kcal:  2300-2600kcal/day  Protein:  115-130g/day  Fluid:  2.3-2.6L/day  Koleen Distance MS, RD, LDN Please refer to [facility] for RD and/or RD on-call/weekend/after hours pager

## 2020-12-25 NOTE — Progress Notes (Addendum)
[facility] SURGICAL PROGRESS NOTE  Hospital Day(s): 1.   Interval History:  Patient seen and examined No acute events or new complaints overnight.  Patient reports his head feels much improved this morning Leukocytosis improved to 10.7K; no fevers Renal function remains elevated but stable; sCr - 1.38: UO - 2.1L Continues to have issues with hypokalemia; down to 2.7 this morning CBGs much improved; now ~180 He continues on Augmentin and Doxycycline Cx growing Gram positive cocci  Vital signs in last 24 hours: [min-max] current  Temp:  [97.8 F (36.6 C)-98.1 F (36.7 C)] 97.8 F (36.6 C) (08/09 0500) Pulse Rate:  [71-93] 87 (08/09 0700) Resp:  [16-45] 17 (08/09 0700) BP: (102-153)/(57-79) 121/65 (08/09 0700) SpO2:  [97 %-100 %] 97 % (08/09 0700) FiO2 (%):  [28 %] 28 % (08/08 1900)     Height: 5\' 11"  (180.3 cm) Weight: 113.4 kg BMI (Calculated): 34.88   Intake/Output last 2 shifts:  08/08 0701 - 08/09 0700 In: 3037 [I.V.:1808.3; IV Piggyback:1228.7] Out: 2120 [Urine:2120]   Physical Exam:  Constitutional: alert, cooperative and no distress  Respiratory: breathing non-labored at rest  Cardiovascular: regular rate and sinus rhythm  Integumentary: I&D to the occiput of his head, erythema, tenderness, and swelling essentially resolved, packing changed  Labs:  CBC Latest Ref Rng & Units 12/25/2020 12/23/2020  WBC 4.0 - 10.5 K/uL 10.7(H) 13.3(H)  Hemoglobin 13.0 - 17.0 g/dL 12.1(L) 15.1  Hematocrit 39.0 - 52.0 % 33.0(L) 42.3  Platelets 150 - 400 K/uL 433(H) 465(H)   CMP Latest Ref Rng & Units 12/25/2020 12/24/2020 12/24/2020  Glucose 70 - 99 mg/dL 02/23/2021) 427(C) 623(J)  BUN 6 - 20 mg/dL 14 14 17   Creatinine 0.61 - 1.24 mg/dL 628(B) ) 1.51(V)  Sodium 135 - 145 mmol/L 129(L) 126(L) 126(L)  Potassium 3.5 - 5.1 mmol/L 2.7(LL) 3.1(L) 2.7(LL)  Chloride 98 - 111 mmol/L 106 106 106  CO2 22 - 32 mmol/L 15(L) 10(L) 14(L)  Calcium 8.9 - 10.3 mg/dL 9.9 9.5 9.7  Total  Protein 6.5 - 8.1 g/dL - - -  Total Bilirubin 0.3 - 1.2 mg/dL - - -  Alkaline Phos 38 - 126 U/L - - -  AST 15 - 41 U/L - - -  ALT 0 - 44 U/L - - -     Imaging studies: No new pertinent imaging studies   Assessment/Plan:  45 y.o. male 1 day s/p bedside I&D for infected scalp cyst, complicated by pertinent comorbidities including new onset T2DM with DKA.   - Continue local wound care as described: Pack wound daily + prn with iodoform gauze, cover and secure  - Continue Abx (Augmentin + Doxycycline); follow up Cx from ID    - Pain control prn    - Further management per primary service; we will sign off. I will update discharge instructions/follow up as it relates to his I&D   All of the above findings and recommendations were discussed with the patient, and the medical team, and all of patient's questions were answered to his expressed satisfaction.  -- 7.37(T, PA-C [facility] 12/25/2020, 7:50 AM [phone] M-F: 7am - 4pm  I saw and evaluated the patient.  I agree with the above documentation, exam, and plan, which I have edited where appropriate. 02/24/2021  4:41 PM

## 2020-12-25 NOTE — Discharge Instructions (Signed)
Wound care: Pack wound daily (and as needed) with packing strips, cover with dry gauze, and secure. You may remove the packing and shower. You should pat dry (do not rub wound), but no baths or submerging incision underwater until follow-up.   Call office [phone] / [phone]) at any time if any questions, worsening pain, fevers/chills, bleeding, drainage from incision site, or other concerns.

## 2020-12-26 DIAGNOSIS — K7581 Nonalcoholic steatohepatitis (NASH): Secondary | ICD-10-CM

## 2020-12-26 DIAGNOSIS — D75838 Other thrombocytosis: Secondary | ICD-10-CM

## 2020-12-26 LAB — CBC WITH DIFFERENTIAL/PLATELET
Abs Immature Granulocytes: 1.23 10*3/uL — ABNORMAL HIGH (ref 0.00–0.07)
Basophils Absolute: 0 10*3/uL (ref 0.0–0.1)
Basophils Relative: 0 %
Eosinophils Absolute: 0 10*3/uL (ref 0.0–0.5)
Eosinophils Relative: 0 %
HCT: 32.2 % — ABNORMAL LOW (ref 39.0–52.0)
Hemoglobin: 12 g/dL — ABNORMAL LOW (ref 13.0–17.0)
Immature Granulocytes: 9 %
Lymphocytes Relative: 8 %
Lymphs Abs: 1.1 10*3/uL (ref 0.7–4.0)
MCH: 28.6 pg (ref 26.0–34.0)
MCHC: 37.3 g/dL — ABNORMAL HIGH (ref 30.0–36.0)
MCV: 76.7 fL — ABNORMAL LOW (ref 80.0–100.0)
Monocytes Absolute: 1.7 10*3/uL — ABNORMAL HIGH (ref 0.1–1.0)
Monocytes Relative: 12 %
Neutro Abs: 9.8 10*3/uL — ABNORMAL HIGH (ref 1.7–7.7)
Neutrophils Relative %: 71 %
Platelets: 447 10*3/uL — ABNORMAL HIGH (ref 150–400)
RBC: 4.2 MIL/uL — ABNORMAL LOW (ref 4.22–5.81)
RDW: 15 % (ref 11.5–15.5)
Smear Review: UNDETERMINED
WBC: 14 10*3/uL — ABNORMAL HIGH (ref 4.0–10.5)
nRBC: 0.1 % (ref 0.0–0.2)

## 2020-12-26 LAB — POTASSIUM
Potassium: 2.9 mmol/L — ABNORMAL LOW (ref 3.5–5.1)
Potassium: 3.1 mmol/L — ABNORMAL LOW (ref 3.5–5.1)
Potassium: 3 mmol/L — ABNORMAL LOW (ref 3.5–5.1)

## 2020-12-26 LAB — GLUCOSE, CAPILLARY
Glucose-Capillary: 150 mg/dL — ABNORMAL HIGH (ref 70–99)
Glucose-Capillary: 159 mg/dL — ABNORMAL HIGH (ref 70–99)
Glucose-Capillary: 164 mg/dL — ABNORMAL HIGH (ref 70–99)
Glucose-Capillary: 166 mg/dL — ABNORMAL HIGH (ref 70–99)
Glucose-Capillary: 168 mg/dL — ABNORMAL HIGH (ref 70–99)
Glucose-Capillary: 178 mg/dL — ABNORMAL HIGH (ref 70–99)
Glucose-Capillary: 182 mg/dL — ABNORMAL HIGH (ref 70–99)
Glucose-Capillary: 190 mg/dL — ABNORMAL HIGH (ref 70–99)
Glucose-Capillary: 201 mg/dL — ABNORMAL HIGH (ref 70–99)
Glucose-Capillary: 217 mg/dL — ABNORMAL HIGH (ref 70–99)
Glucose-Capillary: 230 mg/dL — ABNORMAL HIGH (ref 70–99)
Glucose-Capillary: 269 mg/dL — ABNORMAL HIGH (ref 70–99)
Glucose-Capillary: 327 mg/dL — ABNORMAL HIGH (ref 70–99)

## 2020-12-26 LAB — BETA-HYDROXYBUTYRIC ACID: Beta-Hydroxybutyric Acid: 0.1 mmol/L (ref 0.05–0.27)

## 2020-12-26 LAB — BASIC METABOLIC PANEL
Anion gap: 6 (ref 5–15)
BUN: 8 mg/dL (ref 6–20)
CO2: 19 mmol/L — ABNORMAL LOW (ref 22–32)
Calcium: 9.9 mg/dL (ref 8.9–10.3)
Chloride: 109 mmol/L (ref 98–111)
Creatinine, Ser: 1.28 mg/dL — ABNORMAL HIGH (ref 0.61–1.24)
GFR, Estimated: >60 mL/min (ref >60)
Glucose, Bld: 162 mg/dL — ABNORMAL HIGH (ref 70–99)
Potassium: 2.7 mmol/L — CL (ref 3.5–5.1)
Sodium: 134 mmol/L — ABNORMAL LOW (ref 135–145)

## 2020-12-26 LAB — GLUTAMIC ACID DECARBOXYLASE AUTO ABS: Glutamic Acid Decarb Ab: <5 U/mL (ref 0.0–5.0)

## 2020-12-26 LAB — C-PEPTIDE: C-Peptide: 1.5 ng/mL (ref 1.1–4.4)

## 2020-12-26 LAB — MAGNESIUM: Magnesium: 1.9 mg/dL (ref 1.7–2.4)

## 2020-12-26 LAB — LIPASE, BLOOD: Lipase: 85 U/L — ABNORMAL HIGH (ref 11–51)

## 2020-12-26 MED ORDER — LIDOCAINE 5 % EX PTCH
1.0000 | MEDICATED_PATCH | CUTANEOUS | Status: DC
  Administered 2020-12-26: 1 via TRANSDERMAL
  Filled 2020-12-26 (×5): qty 1

## 2020-12-26 MED ORDER — POLYETHYLENE GLYCOL 3350 17 G PO PACK
17.0000 g | PACK | Freq: Every day | ORAL | Status: DC
  Administered 2020-12-26 – 2020-12-29 (×3): 17 g via ORAL
  Filled 2020-12-26 (×4): qty 1

## 2020-12-26 MED ORDER — PANTOPRAZOLE SODIUM 40 MG PO TBEC: 40.0000 mg | DELAYED_RELEASE_TABLET | Freq: Every day | ORAL | Status: DC

## 2020-12-26 MED ORDER — SUCRALFATE 1 GM/10ML PO SUSP
1.0000 g | Freq: Three times a day (TID) | ORAL | Status: DC
  Administered 2020-12-26 – 2020-12-30 (×15): 1 g via ORAL
  Filled 2020-12-26 (×15): qty 10

## 2020-12-26 MED ORDER — BISACODYL 10 MG RE SUPP: 10.0000 mg | Freq: Every day | RECTAL | Status: DC | PRN

## 2020-12-26 MED ORDER — INSULIN GLARGINE-YFGN 100 UNIT/ML ~~LOC~~ SOLN
30.0000 [IU] | Freq: Every day | SUBCUTANEOUS | Status: DC
  Administered 2020-12-26 – 2020-12-27 (×2): 30 [IU] via SUBCUTANEOUS
  Filled 2020-12-26 (×2): qty 0.3

## 2020-12-26 MED ORDER — CALCIUM CARBONATE ANTACID 500 MG PO CHEW
400.0000 mg | CHEWABLE_TABLET | Freq: Three times a day (TID) | ORAL | Status: DC | PRN
  Administered 2020-12-26 – 2020-12-27 (×3): 400 mg via ORAL
  Filled 2020-12-26 (×3): qty 2

## 2020-12-26 MED ORDER — POTASSIUM CHLORIDE 10 MEQ/100ML IV SOLN
10.0000 meq | INTRAVENOUS | Status: AC
Start: 2020-12-26 — End: 2020-12-26
  Administered 2020-12-26 (×2): 10 meq via INTRAVENOUS
  Filled 2020-12-26 (×2): qty 100

## 2020-12-26 MED ORDER — POTASSIUM PHOSPHATES 15 MMOLE/5ML IV SOLN
30.0000 mmol | Freq: Once | INTRAVENOUS | Status: AC
  Administered 2020-12-26: 30 mmol via INTRAVENOUS
  Filled 2020-12-26: qty 10

## 2020-12-26 MED ORDER — ENSURE MAX PROTEIN PO LIQD
11.0000 [oz_av] | Freq: Two times a day (BID) | ORAL | Status: DC
  Administered 2020-12-26 – 2020-12-30 (×9): 11 [oz_av] via ORAL
  Filled 2020-12-26: qty 330

## 2020-12-26 MED ORDER — POTASSIUM CHLORIDE 10 MEQ/100ML IV SOLN
10.0000 meq | INTRAVENOUS | Status: AC
  Administered 2020-12-26 (×4): 10 meq via INTRAVENOUS
  Filled 2020-12-26 (×4): qty 100

## 2020-12-26 MED ORDER — ADULT MULTIVITAMIN W/MINERALS CH
1.0000 | ORAL_TABLET | Freq: Every day | ORAL | Status: DC
  Administered 2020-12-27 – 2020-12-28 (×2): 1 via ORAL
  Filled 2020-12-26 (×2): qty 1

## 2020-12-26 MED ORDER — PANTOPRAZOLE SODIUM 40 MG PO TBEC
40.0000 mg | DELAYED_RELEASE_TABLET | Freq: Two times a day (BID) | ORAL | Status: DC
  Administered 2020-12-26 – 2020-12-30 (×8): 40 mg via ORAL
  Filled 2020-12-26 (×9): qty 1

## 2020-12-26 MED ORDER — INSULIN ASPART 100 UNIT/ML IJ SOLN
6.0000 [IU] | Freq: Three times a day (TID) | INTRAMUSCULAR | Status: DC
  Administered 2020-12-27 (×2): 6 [IU] via SUBCUTANEOUS
  Filled 2020-12-26 (×2): qty 1

## 2020-12-26 MED ORDER — INSULIN ASPART 100 UNIT/ML IJ SOLN
4.0000 [IU] | Freq: Three times a day (TID) | INTRAMUSCULAR | Status: DC
  Administered 2020-12-26: 17:00:00 4 [IU] via SUBCUTANEOUS
  Filled 2020-12-26: qty 1

## 2020-12-26 MED ORDER — POTASSIUM CHLORIDE 10 MEQ/100ML IV SOLN
10.0000 meq | INTRAVENOUS | Status: AC
Start: 2020-12-26 — End: 2020-12-26
  Administered 2020-12-26 (×4): 10 meq via INTRAVENOUS
  Filled 2020-12-26 (×4): qty 100

## 2020-12-26 MED ORDER — INSULIN ASPART 100 UNIT/ML IJ SOLN
0.0000 [IU] | Freq: Every day | INTRAMUSCULAR | Status: DC
  Administered 2020-12-26 – 2020-12-27 (×2): 3 [IU] via SUBCUTANEOUS
  Filled 2020-12-26 (×3): qty 1

## 2020-12-26 MED ORDER — SENNOSIDES-DOCUSATE SODIUM 8.6-50 MG PO TABS
2.0000 | ORAL_TABLET | Freq: Two times a day (BID) | ORAL | Status: DC
  Administered 2020-12-26 – 2020-12-28 (×4): 2 via ORAL
  Filled 2020-12-26 (×6): qty 2

## 2020-12-26 MED ORDER — INSULIN ASPART 100 UNIT/ML IJ SOLN
0.0000 [IU] | Freq: Three times a day (TID) | INTRAMUSCULAR | Status: DC
  Administered 2020-12-26: 11 [IU] via SUBCUTANEOUS
  Administered 2020-12-27: 12:00:00 6 [IU] via SUBCUTANEOUS
  Administered 2020-12-27: 09:00:00 8 [IU] via SUBCUTANEOUS
  Administered 2020-12-27: 17:00:00 5 [IU] via SUBCUTANEOUS
  Administered 2020-12-28: 09:00:00 8 [IU] via SUBCUTANEOUS
  Administered 2020-12-28: 3 [IU] via SUBCUTANEOUS
  Administered 2020-12-28: 11 [IU] via SUBCUTANEOUS
  Administered 2020-12-29: 17:00:00 3 [IU] via SUBCUTANEOUS
  Administered 2020-12-30: 13:00:00 2 [IU] via SUBCUTANEOUS
  Filled 2020-12-26 (×8): qty 1

## 2020-12-26 MED ORDER — LACTULOSE 10 GM/15ML PO SOLN
20.0000 g | Freq: Once | ORAL | Status: AC
  Administered 2020-12-26: 20 g via ORAL
  Filled 2020-12-26: qty 30

## 2020-12-26 NOTE — Progress Notes (Signed)
Dr.Duncan aware of continued pain to right lateral abdomin, also aware patient has not had BM for a few days.

## 2020-12-26 NOTE — Progress Notes (Signed)
Inpatient Diabetes Program Recommendations  AACE/ADA: New Consensus Statement on Inpatient Glycemic Control   Target Ranges:  Prepandial:   less than 140 mg/dL      Peak postprandial:   less than 180 mg/dL (1-2 hours)      Critically ill patients:  140 - 180 mg/dL   Results for David Watts, David Watts (MRN [id number]) as of 12/26/2020 12:58  Ref. Range 12/26/2020 07:32 12/26/2020 08:33 12/26/2020 09:44 12/26/2020 10:52 12/26/2020 11:45  Glucose-Capillary Latest Ref Range: 70 - 99 mg/dL 299 (H) 371 (H) 696 (H) 230 (H) 201 (H)  Results for David Watts, David Watts (MRN [id number]) as of 12/26/2020 12:58  Ref. Range 12/23/2020 22:51 12/24/2020 01:26  Beta-Hydroxybutyric Acid Latest Ref Range: 0.05 - 0.27 mmol/L  6.08 (H)  Glucose Latest Ref Range: 70 - 99 mg/dL 510 (H) 258 (H)   Results for David Watts, David Watts (MRN [id number]) as of 12/26/2020 12:58  Ref. Range 12/25/2020 04:37  C-Peptide Latest Ref Range: 1.1 - 4.4 ng/mL 1.5  Results for David Watts, David Watts (MRN [id number]) as of 12/26/2020 12:58  Ref. Range 12/24/2020 05:22  Hemoglobin A1C Latest Ref Range: 4.8 - 5.6 % 14.2 (H)   Review of Glycemic Control  Diabetes history: No Outpatient Diabetes medications: NA Current orders for Inpatient glycemic control: Semglee 30 units daily, Novolog 0-15 units TID with meals, Novolog 0-5 units QHS, Novolog 4 units TID with meals for meal coverage  NOTE: Spoke with patient at bedside regarding new DM dx, DKA, and transition to SQ insulin. Patient was able to answer all questions correctly when asked about diabetes survival skills discussed yesterday. Patient states he is feeling better today; still has right upper quadrant abdominal pain (he describes location as under ribs) but notes it is improved today since getting pain patch.  Patient states he was able to eat some yesterday and this morning. He reports indigestion both times after eating. Patient's sister Doroteo Glassman [phone]) called patient's cell phone and had several questions  and concerns so I spoke with her over speaker phone (on patient's cell phone). Westley Hummer states that she is concerned patient continues to have pain and that she has reviewed his MyChart information and she questions if patient has pancreatitis. She asked that the attending provider call her to discuss directly as she would like patient to be worked up to rule out pancreatitis.  Charlene also states that she has called and spoke to Dr. Pricilla Handler office (Endocrinologist at [place]) and they are willing to see patient after discharge if attending provider will make the referral. Informed Charlene that I would ask Dr.  Chipper Herb about making a referral to Dr. Tedd Sias and I would also let him know about her concerns regarding patient's pain and possible pancreatitis (sent secure chat to Dr. Chipper Herb regarding request to call Charlene and her concerns).  Discussed current SQ insulin orders. Discussed Semglee and Novolog insulin and how they work. Patient states he is still having blurry vision so he can't see well.  Patient reports that he would prefer to use insulin pens outpatient.  Patient was able to verbalize and demonstrate how to use an insulin pen successfully. Informed patient that nursing staff will be asking him to self administer insulin so he is comfortable with self injecting prior to discharge. Patient is interested in using FreeStyle Libre2 CGM. Will plan to ask attending provider closer to discharge about providing patient with samples of FreeStyle Libre2 CGMs and providing prescriptions for them.  Patient verbalized understanding of information discussed and states that he has  no further questions or concerns at this time. Inpatient diabetes team will continue to follow along while inpatient.  Thanks, Orlando Penner, RN, MSN, CDE Diabetes Coordinator Inpatient Diabetes Program [phone] (Team Pager from 8am to 5pm)

## 2020-12-26 NOTE — Progress Notes (Addendum)
PROGRESS NOTE    David Watts  CNO:[id number] DOB: 14-Nov-1975 DOA: 12/23/2020 PCP: Pcp, No   Follow-up with DKA Brief Narrative:  David Watts is a 45 y.o. male with No significant past medical history who presents to the ED with a 1 week history of generalized malaise, epigastric and chest pain as well as nonbloody nonbilious vomiting.  Patient had a 1 week history of abdominal cramping pain, nausea vomiting, has not been able to eat.  No bowel movement for 1 week. He also is a chronic smoker, he has some shortness of breath. Arriving the hospital, patient was a found to have new onset diabetes with diabetes ketoacidosis.  He is placed on insulin drip.     Assessment & Plan:   Principal Problem:   DKA (diabetic ketoacidosis), new onset Active Problems:   URI (upper respiratory infection)   AKI (acute kidney injury) (HCC)   Lactic acidosis  #1.  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 Acute kidney injury secondary to diabetes ketoacidosis. Lactic acidosis secondary to dehydration Finally patient DKA has resolved.  Patient still has some metabolic acidosis, but beta hydroxybutyrate acid has normalized.  At this point, I will discontinue insulin drip, give Lantus and scheduled NovoLog and sliding scale insulin.  2.  Hyponatremia Severe hypokalemia. Hypophosphatemia. Patient has been receiving massive amount potassium supplements, I will continue give IV potassium as his potassium level still low.  I will also give potassium phosphate for severe hypophosphatemia. Since I am going discontinue IV fluids, anticipating better potassium level.  I will recheck potassium level every 6 hours and replete as needed.  #3.  Scalp abscess. Status post ID per general surgery pain Continue antibiotics with Augmentin and doxycycline.  4.  Severe constipation. Patient has been starting p.o., will give a dose of lactulose, also added senna twice a day.  5.   Thrombocytosis. Continue to follow.  Endocrinologist: Lucilla Lame  DVT prophylaxis: Lovenox Code Status: full Family Communication: Sister at bedside Disposition Plan:    Status is: Inpatient  Remains inpatient appropriate because:Inpatient level of care appropriate due to severity of illness  Dispo: The patient is from: Home              Anticipated d/c is to: Home              Patient currently is not medically stable to d/c.   Difficult to place patient No        I/O last 3 completed shifts: In: 7083.4 [P.O.:1200; I.V.:3793.6; IV Piggyback:2089.8] Out: 6245 [Urine:6245] No intake/output data recorded.     Consultants:  None  Procedures: None  Antimicrobials:   Augmentin and doxycycline Subjective: Did not feel better today, he started have appetite, no nausea vomiting.  Still has not had a bowel movement since admission to the hospital. Denies any short of breath or cough. No chest pain or palpitation.  He still complaining some right flank rib cage pain, not worse with breathing. No fever chills  No dysuria or hematuria.  Objective: Vitals:   12/26/20 0700 12/26/20 0800 12/26/20 0900 12/26/20 1000  BP: 127/70 123/63 (!) 143/81 (!) 150/95  Pulse: 92 87 88 93  Resp:      Temp:      TempSrc:      SpO2: 98% 96% 96% 97%  Weight:      Height:        Intake/Output Summary (Last 24 hours) at 12/26/2020 1049 Last data filed at 12/26/2020 0700 Gross per  24 hour  Intake 5363 ml  Output 3625 ml  Net 1738 ml   Filed Weights   12/23/20 2210  Weight: 113.4 kg    Examination:  General exam: Appears calm and comfortable  Respiratory system: Clear to auscultation. Respiratory effort normal. Cardiovascular system: S1 & S2 heard, RRR. No JVD, murmurs, rubs, gallops or clicks. No pedal edema. Gastrointestinal system: Abdomen is nondistended, soft and nontender. No organomegaly or masses felt. Normal bowel sounds heard. Central nervous system: Alert and  oriented x3. No focal neurological deficits. Extremities: Symmetric 5 x 5 power. Skin: No rashes, lesions or ulcers Psychiatry: Mood & affect appropriate.  Right flank rib tender to touch.   Data Reviewed: I have personally reviewed following labs and imaging studies  CBC: Recent Labs  Lab 12/23/20 2251 12/25/20 0437 12/26/20 0447  WBC 13.3* 10.7* 14.0*  NEUTROABS  --  7.8* 9.8*  HGB 15.1 12.1* 12.0*  HCT 42.3 33.0* 32.2*  MCV 80.3 78.8* 76.7*  PLT 465* 433* 915*   Basic Metabolic Panel: Recent Labs  Lab 12/24/20 1029 12/24/20 1421 12/25/20 0437 12/25/20 0900 12/25/20 1258 12/25/20 1650 12/25/20 2248 12/26/20 0447  NA 124*   < > 129* 129* 130* 130* 131* 134*  K 2.3*   < > 2.7* 2.9* 2.8* 2.6* 2.7* 2.7*  CL 104   < > 106 107 106 110 108 109  CO2 12*   < > 15* 15* 15* 15* 20* 19*  GLUCOSE 225*   < > 185* 166* 154* 211* 179* 162*  BUN 20   < > $R'14 11 12 9 9 8  'up$ CREATININE 1.24   < > 1.38* 1.37* 1.37* 1.23 1.26* 1.28*  CALCIUM 9.4   < > 9.9 10.0 10.0 9.9 10.0 9.9  MG 2.0  --  2.0  --   --   --   --  1.9  PHOS  --   --   --   --   --   --   --  <1.0*   < > = values in this interval not displayed.   GFR: Estimated Creatinine Clearance: 95.3 mL/min (A) (by C-G formula based on SCr of 1.28 mg/dL (H)). Liver Function Tests: Recent Labs  Lab 12/23/20 2251  AST 30  ALT 24  ALKPHOS 114  BILITOT 2.1*  PROT 7.0  ALBUMIN 2.7*   Recent Labs  Lab 12/24/20 0522  LIPASE 199*   No results for input(s): AMMONIA in the last 168 hours. Coagulation Profile: No results for input(s): INR, PROTIME in the last 168 hours. Cardiac Enzymes: No results for input(s): CKTOTAL, CKMB, CKMBINDEX, TROPONINI in the last 168 hours. BNP (last 3 results) No results for input(s): PROBNP in the last 8760 hours. HbA1C: Recent Labs    12/24/20 0522  HGBA1C 14.2*   CBG: Recent Labs  Lab 12/26/20 0431 12/26/20 0629 12/26/20 0732 12/26/20 0833 12/26/20 0944  GLUCAP 164* 182* 190* 159*  178*   Lipid Profile: No results for input(s): CHOL, HDL, LDLCALC, TRIG, CHOLHDL, LDLDIRECT in the last 72 hours. Thyroid Function Tests: No results for input(s): TSH, T4TOTAL, FREET4, T3FREE, THYROIDAB in the last 72 hours. Anemia Panel: No results for input(s): VITAMINB12, FOLATE, FERRITIN, TIBC, IRON, RETICCTPCT in the last 72 hours. Sepsis Labs: Recent Labs  Lab 12/23/20 2251 12/24/20 0126  LATICACIDVEN 2.8* 2.2*    Recent Results (from the past 240 hour(s))  Resp Panel by RT-PCR (Flu A&B, Covid) Nasopharyngeal Swab     Status: None   Collection  Time: 12/23/20 10:51 PM   Specimen: Nasopharyngeal Swab; Nasopharyngeal(NP) swabs in vial transport medium  Result Value Ref Range Status   SARS Coronavirus 2 by RT PCR NEGATIVE NEGATIVE Final    Comment: (NOTE) SARS-CoV-2 target nucleic acids are NOT DETECTED.  The SARS-CoV-2 RNA is generally detectable in upper respiratory specimens during the acute phase of infection. The lowest concentration of SARS-CoV-2 viral copies this assay can detect is 138 copies/mL. A negative result does not preclude SARS-Cov-2 infection and should not be used as the sole basis for treatment or other patient management decisions. A negative result may occur with  improper specimen collection/handling, submission of specimen other than nasopharyngeal swab, presence of viral mutation(s) within the areas targeted by this assay, and inadequate number of viral copies(<138 copies/mL). A negative result must be combined with clinical observations, patient history, and epidemiological information. The expected result is Negative.  Fact Sheet for Patients:  EntrepreneurPulse.com.au  Fact Sheet for Healthcare Providers:  IncredibleEmployment.be  This test is no t yet approved or cleared by the [place] FDA and  has been authorized for detection and/or diagnosis of SARS-CoV-2 by FDA under an Emergency Use  Authorization (EUA). This EUA will remain  in effect (meaning this test can be used) for the duration of the COVID-19 declaration under Section 564(b)(1) of the Act, 21 U.S.C.section 360bbb-3(b)(1), unless the authorization is terminated  or revoked sooner.       Influenza A by PCR NEGATIVE NEGATIVE Final   Influenza B by PCR NEGATIVE NEGATIVE Final    Comment: (NOTE) The Xpert Xpress SARS-CoV-2/FLU/RSV plus assay is intended as an aid in the diagnosis of influenza from Nasopharyngeal swab specimens and should not be used as a sole basis for treatment. Nasal washings and aspirates are unacceptable for Xpert Xpress SARS-CoV-2/FLU/RSV testing.  Fact Sheet for Patients: EntrepreneurPulse.com.au  Fact Sheet for Healthcare Providers: IncredibleEmployment.be  This test is not yet approved or cleared by the [place] FDA and has been authorized for detection and/or diagnosis of SARS-CoV-2 by FDA under an Emergency Use Authorization (EUA). This EUA will remain in effect (meaning this test can be used) for the duration of the COVID-19 declaration under Section 564(b)(1) of the Act, 21 U.S.C. section 360bbb-3(b)(1), unless the authorization is terminated or revoked.  Performed at [facility], [address] [place], [place] [address]   Aerobic/Anaerobic Culture w Gram Stain (surgical/deep wound)     Status: None (Preliminary result)   Collection Time: 12/24/20  2:21 PM   Specimen: SCALP; Abscess  Result Value Ref Range Status   Specimen Description   Final    SCALP Performed at [facility], [address], [place], [place] [address]    Special Requests   Final    NONE Performed at [facility], [address] [place], [place] [address]    Gram Stain   Final    MODERATE WBC PRESENT, PREDOMINANTLY PMN ABUNDANT GRAM POSITIVE COCCI    Culture   Final    ABUNDANT STAPHYLOCOCCUS LUGDUNENSIS SUSCEPTIBILITIES  TO FOLLOW Performed at [facility], [place] [address], [place], [place] [address]    Report Status PENDING  Incomplete  MRSA Next Gen by PCR, Nasal     Status: None   Collection Time: 12/24/20  6:58 PM   Specimen: Nasal Mucosa; Nasal Swab  Result Value Ref Range Status   MRSA by PCR Next Gen NOT DETECTED NOT DETECTED Final    Comment: (NOTE) The GeneXpert MRSA Assay (  FDA approved for NASAL specimens only), is one component of a comprehensive MRSA colonization surveillance program. It is not intended to diagnose MRSA infection nor to guide or monitor treatment for MRSA infections. Test performance is not FDA approved in patients less than 11 years old. Performed at [facility], [address], [place], [place] [address]          Radiology Studies: US Abdomen Limited RUQ (LIVER/GB)  Result Date: 12/24/2020 CLINICAL DATA:  45 year old male with right upper quadrant pain, nausea and vomiting for 1 week. Abnormal LFTs. EXAM: ULTRASOUND ABDOMEN LIMITED RIGHT UPPER QUADRANT COMPARISON:  None. FINDINGS: Gallbladder: No gallstones or wall thickening visualized. No sonographic Murphy sign noted by sonographer. Common bile duct: Diameter: 3 mm, normal. Liver: Mildly echogenic liver (image 40). No discrete liver lesion. No intrahepatic biliary ductal dilatation. Portal vein is patent on color Doppler imaging with normal direction of blood flow towards the liver. Other: Negative visible right kidney. IMPRESSION: Evidence of hepatic steatosis, otherwise negative right upper quadrant ultrasound. Electronically Signed   By: Genevie Ann M.D.   On: 12/24/2020 11:15        Scheduled Meds:  amoxicillin-clavulanate  1 tablet Oral Q12H   Chlorhexidine Gluconate Cloth  6 each Topical Daily   doxycycline  100 mg Oral Q12H   enoxaparin (LOVENOX) injection  0.5 mg/kg Subcutaneous Q24H   insulin aspart  0-15 Units Subcutaneous TID WC   insulin aspart  0-5 Units Subcutaneous QHS   insulin  aspart  4 Units Subcutaneous TID WC   insulin glargine-yfgn  30 Units Subcutaneous Daily   insulin starter kit- pen needles  1 kit Other Once   lidocaine  1 patch Transdermal Q24H   pantoprazole  40 mg Oral Daily   pneumococcal 23 valent vaccine  0.5 mL Intramuscular Tomorrow-1000   polyethylene glycol  17 g Oral Daily   sodium bicarbonate  1,300 mg Oral BID   Continuous Infusions:  insulin 8 Units/hr (12/26/20 0700)   potassium PHOSPHATE IVPB (in mmol) 30 mmol (12/26/20 0908)     LOS: 2 days    Time spent: 32 minutes.  More than 50% time involving direct patient care.    Sharen Hones, MD [facility]   To contact the attending provider between 7A-7P or the covering provider during after hours 7P-7A, please log into the web site [email or web address] and access using universal [place] password for that web site. If you do not have the password, please call the hospital operator.  12/26/2020, 10:49 AM

## 2020-12-26 NOTE — Progress Notes (Signed)
Pharmacy aware of potassium of 2.7

## 2020-12-26 NOTE — Progress Notes (Signed)
Nutrition Follow Up Note   DOCUMENTATION CODES:   Obesity unspecified  INTERVENTION:   Ensure Max protein supplement BID, each supplement provides 150kcal and 30g of protein.  MVI po daily  Pt at high refeed risk; recommend monitor potassium, magnesium and phosphorus labs daily until stable  Will provide diabetes diet education prior to discharge  NUTRITION DIAGNOSIS:   Inadequate oral intake related to acute illness as evidenced by NPO status. -resolving   GOAL:   Patient will meet greater than or equal to 90% of their needs -progressing   MONITOR:   PO intake, Supplement acceptance, Labs, Weight trends, Skin, I & O's  ASSESSMENT:   45 y/o male admitted with new DM, DKA, AKI, URI and scalp cyst now s/p I & D 8/8  Met with pt in room today. Pt reports that he is still feeling bad today but reports that he is feeling better than yesterday. Pt's main complaint today is heartburn. Pt reports poor appetite and oral intake for one week pta along with an 11lb recent weight weight loss. Pt reports that his appetite is improving today; pt ate french toast for breakfast this morning. RD discussed with pt the importance of adequate nutrition needed to preserve lean muscle. Pt is willing to drink chocolate supplements in hospital; RD will order. Pt is currently refeeding. Pt with abdominal pain and constipation today; bowel regimen initiated. Pt not feeling up for diet education today; will check in tomorrow. Will plan to provide diet education prior to discharge.   Medications reviewed and include: Augmentin, doxycycline, lovenox, protonix, insulin, miralax, senokot, Na bicarbonate, KCl, Kphos   Labs reviewed: Na 134(L), K 2.9(L), creat 1.28(H), P <1.0(L). Mg 1.9 wnl Wbc- 14.0(H), Hgb 12.0(L), Hct 32.2(L), MCV 76.7(L) Cbgs- 190, 159, 178, 230, 201 x 24 hrs  Diet Order:   Diet Order             Diet heart healthy/carb modified Room service appropriate? Yes; Fluid consistency: Thin   Diet effective now                  EDUCATION NEEDS:   Not appropriate for education at this time  Skin:  Skin Assessment: Reviewed RN Assessment (incision head)  Last BM:  PTA  Height:   Ht Readings from Last 1 Encounters:  12/23/20 _0  (1.803 m)    Weight:   Wt Readings from Last 1 Encounters:  12/23/20 113.4 kg    Ideal Body Weight:  78.18 kg  BMI:  Body mass index is 34.87 kg/m.  Estimated Nutritional Needs:   Kcal:  2300-2600kcal/day  Protein:  115-130g/day  Fluid:  2.3-2.6L/day  Koleen Distance MS, RD, LDN Please refer to [facility] for RD and/or RD on-call/weekend/after hours pager

## 2020-12-26 NOTE — Progress Notes (Signed)
Message to Dr.Duncan with patients critical Phosphorus, also updated on patients continued pain.

## 2020-12-27 ENCOUNTER — Inpatient Hospital Stay
Admit: 2020-12-27 | Discharge: 2020-12-27 | Disposition: A | Discharge status: Home / Self Care | Payer: BC Managed Care – PPO | Attending: Internal Medicine | Admitting: Internal Medicine

## 2020-12-27 ENCOUNTER — Inpatient Hospital Stay: Payer: BC Managed Care – PPO

## 2020-12-27 DIAGNOSIS — D75838 Other thrombocytosis: Secondary | ICD-10-CM

## 2020-12-27 LAB — BASIC METABOLIC PANEL
Anion gap: 7 (ref 5–15)
BUN: 10 mg/dL (ref 6–20)
CO2: 20 mmol/L — ABNORMAL LOW (ref 22–32)
Calcium: 9.3 mg/dL (ref 8.9–10.3)
Chloride: 106 mmol/L (ref 98–111)
Creatinine, Ser: 1.17 mg/dL (ref 0.61–1.24)
GFR, Estimated: >60 mL/min (ref >60)
Glucose, Bld: 293 mg/dL — ABNORMAL HIGH (ref 70–99)
Potassium: 3.2 mmol/L — ABNORMAL LOW (ref 3.5–5.1)
Sodium: 133 mmol/L — ABNORMAL LOW (ref 135–145)

## 2020-12-27 LAB — GLUCOSE, CAPILLARY
Glucose-Capillary: 229 mg/dL — ABNORMAL HIGH (ref 70–99)
Glucose-Capillary: 247 mg/dL — ABNORMAL HIGH (ref 70–99)
Glucose-Capillary: 254 mg/dL — ABNORMAL HIGH (ref 70–99)
Glucose-Capillary: 260 mg/dL — ABNORMAL HIGH (ref 70–99)
Glucose-Capillary: 271 mg/dL — ABNORMAL HIGH (ref 70–99)
Glucose-Capillary: 280 mg/dL — ABNORMAL HIGH (ref 70–99)
Glucose-Capillary: 284 mg/dL — ABNORMAL HIGH (ref 70–99)

## 2020-12-27 LAB — BRAIN NATRIURETIC PEPTIDE: B Natriuretic Peptide: 42.4 pg/mL (ref 0.0–100.0)

## 2020-12-27 LAB — PHOSPHORUS
Phosphorus: <1 mg/dL — CL (ref 2.5–4.6)
Phosphorus: 1.5 mg/dL — ABNORMAL LOW (ref 2.5–4.6)

## 2020-12-27 LAB — POTASSIUM
Potassium: 3.2 mmol/L — ABNORMAL LOW (ref 3.5–5.1)
Potassium: 3.2 mmol/L — ABNORMAL LOW (ref 3.5–5.1)

## 2020-12-27 LAB — MAGNESIUM: Magnesium: 1.8 mg/dL (ref 1.7–2.4)

## 2020-12-27 MED ORDER — SPIRONOLACTONE 25 MG PO TABS
25.0000 mg | ORAL_TABLET | Freq: Every day | ORAL | Status: DC
  Administered 2020-12-27 – 2020-12-28 (×2): 25 mg via ORAL
  Filled 2020-12-27 (×2): qty 1

## 2020-12-27 MED ORDER — IPRATROPIUM-ALBUTEROL 0.5-2.5 (3) MG/3ML IN SOLN: 3.0000 mL | Freq: Four times a day (QID) | RESPIRATORY_TRACT | Status: DC | PRN

## 2020-12-27 MED ORDER — POTASSIUM CHLORIDE 10 MEQ/100ML IV SOLN
10.0000 meq | INTRAVENOUS | Status: AC
Start: 2020-12-27 — End: 2020-12-27
  Administered 2020-12-27 (×2): 10 meq via INTRAVENOUS
  Filled 2020-12-27: qty 100

## 2020-12-27 MED ORDER — POTASSIUM PHOSPHATES 15 MMOLE/5ML IV SOLN
30.0000 mmol | Freq: Once | INTRAVENOUS | Status: AC
  Administered 2020-12-27: 30 mmol via INTRAVENOUS
  Filled 2020-12-27: qty 10

## 2020-12-27 MED ORDER — INSULIN ASPART 100 UNIT/ML IJ SOLN
10.0000 [IU] | Freq: Three times a day (TID) | INTRAMUSCULAR | Status: DC
  Administered 2020-12-28 – 2020-12-30 (×5): 10 [IU] via SUBCUTANEOUS
  Filled 2020-12-27 (×5): qty 1

## 2020-12-27 MED ORDER — POTASSIUM CHLORIDE 20 MEQ PO PACK
40.0000 meq | PACK | Freq: Once | ORAL | Status: AC
  Administered 2020-12-27: 03:00:00 40 meq via ORAL
  Filled 2020-12-27: qty 2

## 2020-12-27 MED ORDER — INSULIN GLARGINE-YFGN 100 UNIT/ML ~~LOC~~ SOLN
40.0000 [IU] | Freq: Every day | SUBCUTANEOUS | Status: DC
  Administered 2020-12-28: 40 [IU] via SUBCUTANEOUS
  Filled 2020-12-27: qty 0.4

## 2020-12-27 MED ORDER — POTASSIUM PHOSPHATES 15 MMOLE/5ML IV SOLN
30.0000 mmol | Freq: Once | INTRAVENOUS | Status: DC
  Filled 2020-12-27: qty 10

## 2020-12-27 MED ORDER — ALUM & MAG HYDROXIDE-SIMETH 200-200-20 MG/5ML PO SUSP
30.0000 mL | Freq: Four times a day (QID) | ORAL | Status: DC | PRN
  Administered 2020-12-27 – 2020-12-29 (×4): 30 mL via ORAL
  Filled 2020-12-27 (×4): qty 30

## 2020-12-27 MED ORDER — POTASSIUM CHLORIDE 10 MEQ/100ML IV SOLN
10.0000 meq | INTRAVENOUS | Status: AC
  Administered 2020-12-27 (×4): 10 meq via INTRAVENOUS
  Filled 2020-12-27 (×4): qty 100

## 2020-12-27 NOTE — Progress Notes (Addendum)
PROGRESS NOTE    David Watts  MVH:[id number] DOB: 1975-05-26 DOA: 12/23/2020 PCP: Pcp, No   Follow-up on DKA. Brief Narrative:  David Watts is a 45 y.o. male with No significant past medical history who presents to the ED with a 1 week history of generalized malaise, epigastric and chest pain as well as nonbloody nonbilious vomiting.  Patient had a 1 week history of abdominal cramping pain, nausea vomiting, has not been able to eat.  No bowel movement for 1 week. He also is a chronic smoker, he has some shortness of breath. Arriving the hospital, patient was a found to have new onset diabetes with diabetes ketoacidosis.  He is placed on insulin drip. Patient was able to change to subcu insulin on 8/10.  He has persistent hypokalemia, requiring large amount of IV infusion.   Assessment & Plan:   Principal Problem:   DKA (diabetic ketoacidosis), new onset Active Problems:   URI (upper respiratory infection)   AKI (acute kidney injury) (HCC)   Lactic acidosis   NASH (nonalcoholic steatohepatitis)   Reactive thrombocytosis  #1.  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 Acute kidney injury secondary to diabetes ketoacidosis. Lactic acidosis secondary to dehydration Patient condition finally improved.  Continue insulin, same dose Lantus today, increased dose of NovoLog.  Continue sliding scale insulin.  2.  Hyponatremia Severe hypokalemia. Hypophosphatemia. Will continue replete potassium, will give 20 mEq of IV KCl, 30 mmol of potassium phosphate.  Continue to check potassium every 6 hours.  #3.  Right upper quadrant abdominal pain. Ultrasound did not show any gallstones or cholecystitis.  The pain appears to be due to combination of rib pain with constipation.  Lipase was also normal.  Pain is better after having 2 bowel movements.  Continue Lidoderm patch.  4.  Shortness of breath. Patient appears to be short of breath with exertion.  He did receive large amount of  fluids.  Will obtain echocardiogram, BNP 42.4 not elevated. Initial chest x-ray at admission did not show any acute changes.  Patient is a long-term smoker, he might have some emphysema.  Start as needed Combivent. Patient is seem to have significant weakness, obtain PT.  5.  Reactive thrombocytosis. Continue to follow.  #6.  Scalp abscess. Status post I&D.  Doing well, continue oral antibiotics.  7. Dysphagia and heart burn. Patient had a worsening heartburn and dysphagia, this is probably secondary to nausea vomiting before admission to the hospital.  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  I will obtain an barium esophagram first, EGD as needed.  Continue Protonix, sucralfate, Maalox as needed and TUMs.  DVT prophylaxis: Lovenox Code Status: full Family Communication: sister updated [phone] Disposition Plan:    Status is: Inpatient  Remains inpatient appropriate because:Persistent severe electrolyte disturbances, IV treatments appropriate due to intensity of illness or inability to take PO, and Inpatient level of care appropriate due to severity of illness  Dispo: The patient is from: Home              Anticipated d/c is to: Home              Patient currently is not medically stable to d/c.   Difficult to place patient No        I/O last 3 completed shifts: In: 4377.6 [P.O.:1320; I.V.:1710.4; IV Piggyback:1347.2] Out: 5500 [Urine:5500] Total I/O In: -  Out: 500 [Urine:500]     Consultants:  None  Procedures: None  Antimicrobials: None   Subjective: Patient feels much  better today.  He tolerated diet, no nausea vomiting.  He had 2 large bowel movement after Taking lactulose.  Abdominal pain much improved. Patient appears to have some short of breath when he was talking to me, he has dyspnea with exertion.  He does not have a cough.  He does not have chest pain. Denies any fever or chills. No dysuria hematuria. No headache or dizziness.  Objective: Vitals:    12/26/20 2353 12/27/20 0414 12/27/20 0415 12/27/20 0741  BP: 122/73 136/84  134/82  Pulse: 91 (!) 102 (!) 102 (!) 102  Resp: $Remo'18 18  20  'MGSrj$ Temp: 98 F (36.7 C) 97.9 F (36.6 C)  98.6 F (37 C)  TempSrc: Oral Oral  Oral  SpO2:  98% 98% 96%  Weight:      Height:        Intake/Output Summary (Last 24 hours) at 12/27/2020 1007 Last data filed at 12/27/2020 0741 Gross per 24 hour  Intake 865.73 ml  Output 2875 ml  Net -2009.27 ml   Filed Weights   12/23/20 2210  Weight: 113.4 kg    Examination:  General exam: Appears calm and comfortable  Respiratory system: Clear to auscultation. Respiratory effort normal. Cardiovascular system: S1 & S2 heard, RRR. No JVD, murmurs, rubs, gallops or clicks. No pedal edema. Gastrointestinal system: Abdomen is nondistended, soft and nontender. No organomegaly or masses felt. Normal bowel sounds heard. Central nervous system: Alert and oriented x3. No focal neurological deficits. Extremities: Symmetric 5 x 5 power. Skin: No rashes, lesions or ulcers Psychiatry:Mood & affect appropriate.     Data Reviewed: I have personally reviewed following labs and imaging studies  CBC: Recent Labs  Lab 12/23/20 2251 12/25/20 0437 12/26/20 0447  WBC 13.3* 10.7* 14.0*  NEUTROABS  --  7.8* 9.8*  HGB 15.1 12.1* 12.0*  HCT 42.3 33.0* 32.2*  MCV 80.3 78.8* 76.7*  PLT 465* 433* 765*   Basic Metabolic Panel: Recent Labs  Lab 12/24/20 1029 12/24/20 1421 12/25/20 0437 12/25/20 0900 12/25/20 1258 12/25/20 1650 12/25/20 2248 12/26/20 0447 12/26/20 1122 12/26/20 1700 12/26/20 2301 12/27/20 0601  NA 124*   < > 129*   < > 130* 130* 131* 134*  --   --   --  133*  K 2.3*   < > 2.7*   < > 2.8* 2.6* 2.7* 2.7* 2.9* 3.1* 3.0* 3.2*  CL 104   < > 106   < > 106 110 108 109  --   --   --  106  CO2 12*   < > 15*   < > 15* 15* 20* 19*  --   --   --  20*  GLUCOSE 225*   < > 185*   < > 154* 211* 179* 162*  --   --   --  293*  BUN 20   < > 14   < > $R'12 9 9 8  'GA$ --   --    --  10  CREATININE 1.24   < > 1.38*   < > 1.37* 1.23 1.26* 1.28*  --   --   --  1.17  CALCIUM 9.4   < > 9.9   < > 10.0 9.9 10.0 9.9  --   --   --  9.3  MG 2.0  --  2.0  --   --   --   --  1.9  --   --   --  1.8  PHOS  --   --   --   --   --   --   --  <  1.0*  --   --   --  1.5*   < > = values in this interval not displayed.   GFR: Estimated Creatinine Clearance: 104.2 mL/min (by C-G formula based on SCr of 1.17 mg/dL). Liver Function Tests: Recent Labs  Lab 12/23/20 2251  AST 30  ALT 24  ALKPHOS 114  BILITOT 2.1*  PROT 7.0  ALBUMIN 2.7*   Recent Labs  Lab 12/24/20 0522 12/26/20 1700  LIPASE 199* 85*   No results for input(s): AMMONIA in the last 168 hours. Coagulation Profile: No results for input(s): INR, PROTIME in the last 168 hours. Cardiac Enzymes: No results for input(s): CKTOTAL, CKMB, CKMBINDEX, TROPONINI in the last 168 hours. BNP (last 3 results) No results for input(s): PROBNP in the last 8760 hours. HbA1C: No results for input(s): HGBA1C in the last 72 hours. CBG: Recent Labs  Lab 12/26/20 1940 12/26/20 2358 12/27/20 0413 12/27/20 0743 12/27/20 0857  GLUCAP 269* 247* 284* 271* 280*   Lipid Profile: No results for input(s): CHOL, HDL, LDLCALC, TRIG, CHOLHDL, LDLDIRECT in the last 72 hours. Thyroid Function Tests: No results for input(s): TSH, T4TOTAL, FREET4, T3FREE, THYROIDAB in the last 72 hours. Anemia Panel: No results for input(s): VITAMINB12, FOLATE, FERRITIN, TIBC, IRON, RETICCTPCT in the last 72 hours. Sepsis Labs: Recent Labs  Lab 12/23/20 2251 12/24/20 0126  LATICACIDVEN 2.8* 2.2*    Recent Results (from the past 240 hour(s))  Resp Panel by RT-PCR (Flu A&B, Covid) Nasopharyngeal Swab     Status: None   Collection Time: 12/23/20 10:51 PM   Specimen: Nasopharyngeal Swab; Nasopharyngeal(NP) swabs in vial transport medium  Result Value Ref Range Status   SARS Coronavirus 2 by RT PCR NEGATIVE NEGATIVE Final    Comment:  (NOTE) SARS-CoV-2 target nucleic acids are NOT DETECTED.  The SARS-CoV-2 RNA is generally detectable in upper respiratory specimens during the acute phase of infection. The lowest concentration of SARS-CoV-2 viral copies this assay can detect is 138 copies/mL. A negative result does not preclude SARS-Cov-2 infection and should not be used as the sole basis for treatment or other patient management decisions. A negative result may occur with  improper specimen collection/handling, submission of specimen other than nasopharyngeal swab, presence of viral mutation(s) within the areas targeted by this assay, and inadequate number of viral copies(<138 copies/mL). A negative result must be combined with clinical observations, patient history, and epidemiological information. The expected result is Negative.  Fact Sheet for Patients:  EntrepreneurPulse.com.au  Fact Sheet for Healthcare Providers:  IncredibleEmployment.be  This test is no t yet approved or cleared by the [place] FDA and  has been authorized for detection and/or diagnosis of SARS-CoV-2 by FDA under an Emergency Use Authorization (EUA). This EUA will remain  in effect (meaning this test can be used) for the duration of the COVID-19 declaration under Section 564(b)(1) of the Act, 21 U.S.C.section 360bbb-3(b)(1), unless the authorization is terminated  or revoked sooner.       Influenza A by PCR NEGATIVE NEGATIVE Final   Influenza B by PCR NEGATIVE NEGATIVE Final    Comment: (NOTE) The Xpert Xpress SARS-CoV-2/FLU/RSV plus assay is intended as an aid in the diagnosis of influenza from Nasopharyngeal swab specimens and should not be used as a sole basis for treatment. Nasal washings and aspirates are unacceptable for Xpert Xpress SARS-CoV-2/FLU/RSV testing.  Fact Sheet for Patients: EntrepreneurPulse.com.au  Fact Sheet for Healthcare  Providers: IncredibleEmployment.be  This test is not yet approved or cleared by the [place]  FDA and has been authorized for detection and/or diagnosis of SARS-CoV-2 by FDA under an Emergency Use Authorization (EUA). This EUA will remain in effect (meaning this test can be used) for the duration of the COVID-19 declaration under Section 564(b)(1) of the Act, 21 U.S.C. section 360bbb-3(b)(1), unless the authorization is terminated or revoked.  Performed at [facility], [address] [place], [place] [address]   Aerobic/Anaerobic Culture w Gram Stain (surgical/deep wound)     Status: None (Preliminary result)   Collection Time: 12/24/20  2:21 PM   Specimen: SCALP; Abscess  Result Value Ref Range Status   Specimen Description   Final    SCALP Performed at [facility], [address] [place], [place] [address]    Special Requests   Final    NONE Performed at [facility], [address] [place], [place] [address]    Gram Stain   Final    MODERATE WBC PRESENT, PREDOMINANTLY PMN ABUNDANT GRAM POSITIVE COCCI Performed at [facility], [address], [place], [place] [address]    Culture   Final    ABUNDANT STAPHYLOCOCCUS LUGDUNENSIS NO ANAEROBES ISOLATED; CULTURE IN PROGRESS FOR 5 DAYS    Report Status PENDING  Incomplete   Organism ID, Bacteria STAPHYLOCOCCUS LUGDUNENSIS  Final      Susceptibility   Staphylococcus lugdunensis - MIC*    CIPROFLOXACIN <=0.5 SENSITIVE Sensitive     ERYTHROMYCIN <=0.25 SENSITIVE Sensitive     GENTAMICIN <=0.5 SENSITIVE Sensitive     OXACILLIN 0.5 SENSITIVE Sensitive     TETRACYCLINE <=1 SENSITIVE Sensitive     VANCOMYCIN <=0.5 SENSITIVE Sensitive     TRIMETH/SULFA <=10 SENSITIVE Sensitive     CLINDAMYCIN <=0.25 SENSITIVE Sensitive     RIFAMPIN <=0.5 SENSITIVE Sensitive     Inducible Clindamycin NEGATIVE Sensitive     * ABUNDANT STAPHYLOCOCCUS LUGDUNENSIS  MRSA Next Gen by PCR,  Nasal     Status: None   Collection Time: 12/24/20  6:58 PM   Specimen: Nasal Mucosa; Nasal Swab  Result Value Ref Range Status   MRSA by PCR Next Gen NOT DETECTED NOT DETECTED Final    Comment: (NOTE) The GeneXpert MRSA Assay (FDA approved for NASAL specimens only), is one component of a comprehensive MRSA colonization surveillance program. It is not intended to diagnose MRSA infection nor to guide or monitor treatment for MRSA infections. Test performance is not FDA approved in patients less than 79 years old. Performed at [facility], [address] [place], [place] [address]          Radiology Studies: No results found.      Scheduled Meds:  amoxicillin-clavulanate  1 tablet Oral Q12H   Chlorhexidine Gluconate Cloth  6 each Topical Daily   doxycycline  100 mg Oral Q12H   enoxaparin (LOVENOX) injection  0.5 mg/kg Subcutaneous Q24H   insulin aspart  0-15 Units Subcutaneous TID WC   insulin aspart  0-5 Units Subcutaneous QHS   insulin aspart  6 Units Subcutaneous TID WC   insulin glargine-yfgn  30 Units Subcutaneous Daily   insulin starter kit- pen needles  1 kit Other Once   lidocaine  1 patch Transdermal Q24H   multivitamin with minerals  1 tablet Oral Daily   pantoprazole  40 mg Oral BID   pneumococcal 23 valent vaccine  0.5 mL Intramuscular Tomorrow-1000   polyethylene glycol  17 g Oral Daily   Ensure Max Protein  11 oz Oral BID  senna-docusate  2 tablet Oral BID   sodium bicarbonate  1,300 mg Oral BID   sucralfate  1 g Oral TID WC & HS   Continuous Infusions:  potassium chloride 10 mEq (12/27/20 0958)   potassium PHOSPHATE IVPB (in mmol)       LOS: 3 days    Time spent: 32 minutes    Sharen Hones, MD [facility]   To contact the attending provider between 7A-7P or the covering provider during after hours 7P-7A, please log into the web site [email or web address] and access using universal [place] password for that web site. If you do  not have the password, please call the hospital operator.  12/27/2020, 10:07 AM

## 2020-12-27 NOTE — Progress Notes (Signed)
Inpatient Diabetes Program Recommendations  AACE/ADA: New Consensus Statement on Inpatient Glycemic Control (2015)  Target Ranges:  Prepandial:   less than 140 mg/dL      Peak postprandial:   less than 180 mg/dL (1-2 hours)      Critically ill patients:  140 - 180 mg/dL  Results for HORATIO, BERTZ (MRN [id number]) as of 12/27/2020 13:53  Ref. Range 12/26/2020 23:58 12/27/2020 04:13 12/27/2020 07:43 12/27/2020 08:57 12/27/2020 11:39  Glucose-Capillary Latest Ref Range: 70 - 99 mg/dL 354 (H)  3 units NOVOLOG  284 (H) 271 (H) 280 (H)  14 units NOVOLOG  30 units Semglee  260 (H)  12 units NOVOLOG    Current Orders: Semglee 30 units Daily Novolog 0-15 units TID ac/hs  Novolog 6 units TID with meals     Pt transitioned to SQ Insulin yest around 11am--Novolog not started until 5pm last night  MD- Note CBGs remain >250 today  Please consider:  1. Increase Semglee to 40 units Daily (0.35 units/kg)  2. Increase Novolog Meal Coverage to 10 units TID with meals    --Will follow patient during hospitalization--  Ambrose Finland RN, MSN, CDE Diabetes Coordinator Inpatient Glycemic Control Team Team Pager: [phone] (8a-5p)

## 2020-12-27 NOTE — TOC Initial Note (Signed)
Transition of Care [facility]) - Initial/Assessment Note    Patient Details  Name: David Watts MRN: [id number] Date of Birth: [birth year]  [facility]) CM/SW Contact:    Shelbie Hutching, RN Phone Number: 12/27/2020, 12:06 PM  Clinical Narrative:                 Patient admitted to the hospital with DKA, newly diagnosed diabetic this admission.  Patient will discharge the hospital on insulin.  Diabetes coordinator is working with patient.  RNCM met with patient at the bedside to discuss hospital follow up.  Patient does not currently have a PCP but his sister is down here from [place] and she is helping him find one, they have already found him an endocrinologist at [facility].  RNCM informed patient and sister that they do have internal medicine Mds at [facility] and they could also look in to [place] off of [place].    Patient works at the [employer].  He will use [facility] for his prescriptions.    Expected Discharge Plan: Home/Self Care Barriers to Discharge: Continued Medical Work up   Patient Goals and CMS Choice Patient states their goals for this hospitalization and ongoing recovery are:: Patient just taking things one day at a time      Expected Discharge Plan and Services Expected Discharge Plan: Home/Self Care   Discharge Planning Services: CM Consult   Living arrangements for the past 2 months: Single Family Home                 DME Arranged: N/A DME Agency: NA       HH Arranged: NA [place] Agency: NA        Prior Living Arrangements/Services Living arrangements for the past 2 months: Single Family Home Lives with:: Self Patient language and need for interpreter reviewed:: Yes Do you feel safe going back to the place where you live?: Yes      Need for Family Participation in Patient Care: Yes (Comment) (new diabetic) Care giver support system in place?: Yes (comment) (sisters)   Criminal Activity/Legal Involvement  Pertinent to Current Situation/Hospitalization: No - Comment as needed  Activities of Daily Living Home Assistive Devices/Equipment: None ADL Screening (condition at time of admission) Patient's cognitive ability adequate to safely complete daily activities?: Yes Is the patient deaf or have difficulty hearing?: No Does the patient have difficulty seeing, even when wearing glasses/contacts?: No Does the patient have difficulty concentrating, remembering, or making decisions?: No Patient able to express need for assistance with ADLs?: No Does the patient have difficulty dressing or bathing?: No Independently performs ADLs?: Yes (appropriate for developmental age) Does the patient have difficulty walking or climbing stairs?: No Weakness of Legs: None Weakness of Arms/Hands: None  Permission Sought/Granted Permission sought to share information with : Case Manager, Family Supports Permission granted to share information with : Yes, Verbal Permission Granted        Permission granted to share info w Relationship: sister     Emotional Assessment Appearance:: Appears stated age Attitude/Demeanor/Rapport: Engaged Affect (typically observed): Accepting Orientation: : Oriented to Self, Oriented to Place, Oriented to  Time, Oriented to Situation Alcohol / Substance Use: Not Applicable Psych Involvement: No (comment)  Admission diagnosis:  DKA (diabetic ketoacidosis) ([place]) [E11.10] Abdominal pain [R10.9] Chest pain [R07.9] Patient Active Problem List   Diagnosis Date Noted   NASH (nonalcoholic steatohepatitis) 12/26/2020   Reactive thrombocytosis 12/26/2020   DKA (diabetic ketoacidosis), new onset  12/24/2020   URI (upper respiratory infection) 12/24/2020   AKI (acute kidney injury) ([place]) 12/24/2020   Lactic acidosis 12/24/2020   PCP:  Merryl Hacker No Pharmacy:   [facility] [address] [place]) [place]  [address] Phone: [phone] Fax: [phone]     Social Determinants of Health (SDOH) Interventions    Readmission Risk Interventions No flowsheet data found.

## 2020-12-28 ENCOUNTER — Inpatient Hospital Stay (HOSPITAL_COMMUNITY)
Admit: 2020-12-28 | Discharge: 2020-12-28 | Disposition: A | Discharge status: Home / Self Care | Payer: BC Managed Care – PPO | Attending: Internal Medicine | Admitting: Internal Medicine

## 2020-12-28 DIAGNOSIS — R1310 Dysphagia, unspecified: Secondary | ICD-10-CM | POA: Diagnosis not present

## 2020-12-28 DIAGNOSIS — D509 Iron deficiency anemia, unspecified: Secondary | ICD-10-CM

## 2020-12-28 DIAGNOSIS — I5031 Acute diastolic (congestive) heart failure: Secondary | ICD-10-CM

## 2020-12-28 DIAGNOSIS — E876 Hypokalemia: Secondary | ICD-10-CM | POA: Diagnosis not present

## 2020-12-28 DIAGNOSIS — R933 Abnormal findings on diagnostic imaging of other parts of digestive tract: Secondary | ICD-10-CM | POA: Diagnosis not present

## 2020-12-28 DIAGNOSIS — E871 Hypo-osmolality and hyponatremia: Secondary | ICD-10-CM

## 2020-12-28 LAB — CBC WITH DIFFERENTIAL/PLATELET
Abs Immature Granulocytes: 1.33 10*3/uL — ABNORMAL HIGH (ref 0.00–0.07)
Basophils Absolute: 0.1 10*3/uL (ref 0.0–0.1)
Basophils Relative: 1 %
Eosinophils Absolute: 0 10*3/uL (ref 0.0–0.5)
Eosinophils Relative: 0 %
HCT: 28.8 % — ABNORMAL LOW (ref 39.0–52.0)
Hemoglobin: 10.6 g/dL — ABNORMAL LOW (ref 13.0–17.0)
Immature Granulocytes: 9 %
Lymphocytes Relative: 12 %
Lymphs Abs: 1.7 10*3/uL (ref 0.7–4.0)
MCH: 28.7 pg (ref 26.0–34.0)
MCHC: 36.8 g/dL — ABNORMAL HIGH (ref 30.0–36.0)
MCV: 78 fL — ABNORMAL LOW (ref 80.0–100.0)
Monocytes Absolute: 2.4 10*3/uL — ABNORMAL HIGH (ref 0.1–1.0)
Monocytes Relative: 17 %
Neutro Abs: 8.8 10*3/uL — ABNORMAL HIGH (ref 1.7–7.7)
Neutrophils Relative %: 61 %
Platelets: 382 10*3/uL (ref 150–400)
RBC: 3.69 MIL/uL — ABNORMAL LOW (ref 4.22–5.81)
RDW: 15.7 % — ABNORMAL HIGH (ref 11.5–15.5)
Smear Review: NORMAL
WBC: 14.2 10*3/uL — ABNORMAL HIGH (ref 4.0–10.5)
nRBC: 0 % (ref 0.0–0.2)

## 2020-12-28 LAB — ECHOCARDIOGRAM COMPLETE
AR max vel: 2.42 cm2
AV Area VTI: 2.84 cm2
AV Area mean vel: 2.35 cm2
AV Mean grad: 5 mmHg
AV Peak grad: 8.2 mmHg
Ao pk vel: 1.43 m/s
Area-P 1/2: 4.1 cm2
Height: 71 in
S' Lateral: 2.9 cm
Weight: 4000 oz

## 2020-12-28 LAB — BASIC METABOLIC PANEL
Anion gap: 8 (ref 5–15)
BUN: 11 mg/dL (ref 6–20)
CO2: 24 mmol/L (ref 22–32)
Calcium: 8.7 mg/dL — ABNORMAL LOW (ref 8.9–10.3)
Chloride: 104 mmol/L (ref 98–111)
Creatinine, Ser: 1.18 mg/dL (ref 0.61–1.24)
GFR, Estimated: >60 mL/min (ref >60)
Glucose, Bld: 244 mg/dL — ABNORMAL HIGH (ref 70–99)
Potassium: 3.1 mmol/L — ABNORMAL LOW (ref 3.5–5.1)
Sodium: 136 mmol/L (ref 135–145)

## 2020-12-28 LAB — POTASSIUM
Potassium: 3.3 mmol/L — ABNORMAL LOW (ref 3.5–5.1)
Potassium: 3.2 mmol/L — ABNORMAL LOW (ref 3.5–5.1)
Potassium: 3.2 mmol/L — ABNORMAL LOW (ref 3.5–5.1)
Potassium: 3.8 mmol/L (ref 3.5–5.1)

## 2020-12-28 LAB — MAGNESIUM: Magnesium: 1.6 mg/dL — ABNORMAL LOW (ref 1.7–2.4)

## 2020-12-28 LAB — GLUCOSE, CAPILLARY
Glucose-Capillary: 250 mg/dL — ABNORMAL HIGH (ref 70–99)
Glucose-Capillary: 322 mg/dL — ABNORMAL HIGH (ref 70–99)
Glucose-Capillary: 199 mg/dL — ABNORMAL HIGH (ref 70–99)
Glucose-Capillary: 198 mg/dL — ABNORMAL HIGH (ref 70–99)

## 2020-12-28 LAB — PHOSPHORUS: Phosphorus: 1.9 mg/dL — ABNORMAL LOW (ref 2.5–4.6)

## 2020-12-28 MED ORDER — POTASSIUM CHLORIDE 10 MEQ/100ML IV SOLN
10.0000 meq | INTRAVENOUS | Status: AC
Start: 2020-12-28 — End: 2020-12-28
  Administered 2020-12-28 (×2): 10 meq via INTRAVENOUS
  Filled 2020-12-28 (×2): qty 100

## 2020-12-28 MED ORDER — POTASSIUM PHOSPHATES 15 MMOLE/5ML IV SOLN
30.0000 mmol | Freq: Once | INTRAVENOUS | Status: AC
  Administered 2020-12-28: 30 mmol via INTRAVENOUS
  Filled 2020-12-28: qty 10

## 2020-12-28 MED ORDER — SPIRONOLACTONE 25 MG PO TABS
25.0000 mg | ORAL_TABLET | Freq: Two times a day (BID) | ORAL | Status: DC
  Administered 2020-12-28 – 2020-12-29 (×2): 25 mg via ORAL
  Filled 2020-12-28 (×2): qty 1

## 2020-12-28 MED ORDER — POTASSIUM CHLORIDE 10 MEQ/100ML IV SOLN
10.0000 meq | INTRAVENOUS | Status: AC
  Administered 2020-12-28 – 2020-12-29 (×4): 10 meq via INTRAVENOUS
  Filled 2020-12-28 (×4): qty 100

## 2020-12-28 MED ORDER — INSULIN GLARGINE-YFGN 100 UNIT/ML ~~LOC~~ SOLN
25.0000 [IU] | Freq: Two times a day (BID) | SUBCUTANEOUS | Status: DC
  Administered 2020-12-29 – 2020-12-30 (×3): 25 [IU] via SUBCUTANEOUS
  Filled 2020-12-28 (×5): qty 0.25

## 2020-12-28 MED ORDER — POTASSIUM CHLORIDE 10 MEQ/100ML IV SOLN
10.0000 meq | INTRAVENOUS | Status: AC
Start: 2020-12-28 — End: 2020-12-28
  Administered 2020-12-28 (×2): 10 meq via INTRAVENOUS
  Filled 2020-12-28 (×2): qty 100

## 2020-12-28 MED ORDER — MAGNESIUM SULFATE 50 % IJ SOLN
3.0000 g | Freq: Once | INTRAVENOUS | Status: AC
  Administered 2020-12-28: 3 g via INTRAVENOUS
  Filled 2020-12-28: qty 6

## 2020-12-28 NOTE — Progress Notes (Addendum)
Melodie Bouillon, MD [address], [address], [place], [place], [address] [address], [address], [place], [place], [address] Phone: [phone]  Fax: [phone]  Consultation  Referring Provider:     Dr. Chipper Herb Primary Care Physician:  Pcp, No Reason for Consultation:     Esophageal ulcer  Date of Admission:  12/23/2020 Date of Consultation:  12/28/2020         HPI:   David Watts is a 45 y.o. male admitted 4 days ago, with generalized malaise, epigastric and chest pain, nausea vomiting, for 1 week with no hematemesis.  Patient complained of odynophagia and upper GI series was done which showed broad-based ulcer in the lower esophagus measuring up to 2.7 cm, small hiatal hernia.  Upper GI series also notes that patient had severe burning sensation after swallowing barium.  Patient was diagnosed with new onset diabetes on admission, admitted with DKA, and was initially on insulin drip, but has been transitioned to subcu insulin.  Patient reports odynophagia and burning sensation at lower chest upon swallowing, that has only been going on since his presentation and since his symptoms of nausea vomiting started.  No prior history of similar symptoms.  No prior EGD.  No weight loss.  Does also report associated epigastric pain, dull, 3/10, nonradiating, worsens with meals.  Patient was started on Carafate and Protonix as an inpatient.  States symptoms have improved over the last day, but not completely resolved.  Denies any altered bowel habits.  No prior colonoscopy.  No family history of GI malignancy  Past medical history: Patient denies any previous history of medical conditions or any previous medical diagnosis. Diagnosed with new onset diabetes on this admission  Past surgical history: Wisdom tooth extraction  Prior to Admission medications   Medication Sig Start Date End Date Taking? Authorizing Provider  ibuprofen (ADVIL) 800 MG tablet Take 800 mg by mouth every 8 (eight)  hours as needed.   Yes [provider]  cephALEXin (KEFLEX) 500 MG capsule Take 500 mg by mouth every 12 (twelve) hours. Patient not taking: Reported on 12/24/2020 12/18/20   [provider]  ondansetron (ZOFRAN-ODT) 4 MG disintegrating tablet Take by mouth. Patient not taking: Reported on 12/24/2020 12/18/20   [provider]    Family History: Mother had stroke and died from it. Father had lung cancer  Social History   Tobacco Use   Smoking status: Never   Smokeless tobacco: Never  Substance Use Topics   Alcohol use: Never    Allergies as of 12/23/2020   (No Known Allergies)    Review of Systems:    All systems reviewed and negative except where noted in HPI.   Physical Exam:  Constitutional: General:   Alert,  Well-developed, well-nourished, pleasant and cooperative in NAD BP 128/78 (BP Location: Right Arm)   Pulse 99   Temp (!) 97.5 F (36.4 C) (Oral)   Resp 18   Ht 5\' 11"  (1.803 m)   Wt 113.4 kg   SpO2 98%   BMI 34.87 kg/m   Eyes:  Sclera clear, no icterus.   Conjunctiva pink. PERRLA  Ears:  No scars, lesions or masses, Normal auditory acuity. Nose:  No deformity, discharge, or lesions. Mouth:  No deformity or lesions, oropharynx pink & moist.  Neck:  Supple; no masses or thyromegaly.  Respiratory: Normal respiratory effort, Normal percussion  Gastrointestinal:  Normal bowel sounds.  No bruits.  Soft, non-tender and non-distended without masses, hepatosplenomegaly or hernias noted.  No guarding or rebound tenderness.     Cardiac: No clubbing or edema.  No cyanosis. Normal posterior tibial pedal pulses noted.  Lymphatic:  No significant cervical or axillary adenopathy.  Psych:  Alert and cooperative. Normal mood and affect.  Musculoskeletal:  Normal gait. Head normocephalic, atraumatic. Symmetrical without gross deformities. 5/5 Upper and Lower extremity strength bilaterally.  Skin: Warm. Intact without significant lesions or rashes.  No jaundice.  Neurologic:  Face symmetrical, tongue midline, Normal sensation to touch;  grossly normal neurologically.  Psych:  Alert and oriented x3, Alert and cooperative. Normal mood and affect.   LAB RESULTS: Recent Labs    12/26/20 0447 12/28/20 0558  WBC 14.0* 14.2*  HGB 12.0* 10.6*  HCT 32.2* 28.8*  PLT 447* 382   BMET Recent Labs    12/26/20 0447 12/26/20 1122 12/27/20 0601 12/27/20 1116 12/27/20 2302 12/28/20 0558 12/28/20 1051  NA 134*  --  133*  --   --  136  --   K 2.7*   < > 3.2*   < > 3.3* 3.1* 3.2*  CL 109  --  106  --   --  104  --   CO2 19*  --  20*  --   --  24  --   GLUCOSE 162*  --  293*  --   --  244*  --   BUN 8  --  10  --   --  11  --   CREATININE 1.28*  --  1.17  --   --  1.18  --   CALCIUM 9.9  --  9.3  --   --  8.7*  --    < > = values in this interval not displayed.   LFT No results for input(s): PROT, ALBUMIN, AST, ALT, ALKPHOS, BILITOT, BILIDIR, IBILI in the last 72 hours. PT/INR No results for input(s): LABPROT, INR in the last 72 hours.  STUDIES: DG ESOPHAGUS W SINGLE CM (SOL OR THIN BA)  Result Date: 12/27/2020 CLINICAL DATA:  Epigastric pain and dysphagia EXAM: ESOPHOGRAM/BARIUM SWALLOW TECHNIQUE: Single contrast examination was performed using  thin barium. FLUOROSCOPY TIME:  Fluoroscopy Time:  1 minutes 48 seconds Radiation Exposure Index (if provided by the fluoroscopic device): 30.7 mGy Number of Acquired Spot Images: 3 COMPARISON:  None. FINDINGS: Limited supine LPO esophagram due to patient condition. Mild esophageal dysmotility. No obstruction to the forward flow of contrast throughout the esophagus and into the stomach. There is evidence of a broad-based ulcer in the lower esophagus measuring up to 2.7 cm in length. Small sliding hiatal hernia. No gastroesophageal reflux occurred during the exam. The exam was stopped due to patient tolerance and condition. The patient had severe burning sensation after swallowing the barium.  IMPRESSION: Evidence of a broad-based ulcer in the lower esophagus measuring up to 2.7 cm. Recommend correlation with endoscopy. Mild esophageal dysmotility. Small sliding-type hiatal hernia. No gastroesophageal reflux occurred during the exam. Limited esophagram due to patient condition. Patient had a severe burning sensation after swallowing barium. Electronically Signed   By: Caprice Renshaw M.D.   On: 12/27/2020 15:22    Echo IMPRESSIONS     1. Left ventricular ejection fraction, by estimation, is 60 to 65%. The  left ventricle has normal function. The left ventricle has no regional  wall motion abnormalities. Left ventricular diastolic parameters were  normal.   2. Right ventricular systolic function is normal. The right ventricular  size is normal.   3. The mitral valve  is normal in structure. No evidence of mitral valve  regurgitation.   4. The aortic valve is grossly normal. Aortic valve regurgitation is not  visualized.    Impression / Plan:   David Watts is a 45 y.o. y/o male with generalized malaise, weakness, nausea vomiting for 1 week prior to presentation and diagnosed with new onset diabetes and DKA on admission, now on subq insulin, reporting burning sensation in lower chest after eating, odynophagia, with upper GI series showing a broad-based esophageal ulcer in the distal esophagus  Given the abnormal upper GI series, and patient's ongoing symptoms, EGD is indicated for further evaluation  Patient also has microcytic anemia, with no evidence of active GI bleeding.  Patient will need anemia work-up as an inpatient or outpatient, and may need a colonoscopy on an elective basis for further evaluation if he has iron deficiency anemia  However, at this time in an inpatient setting with new onset diabetes, DKA on admission, would only recommend EGD at this time.  If anemia worsens, and is not explained via EGD findings, colonoscopy as an inpatient can also be considered  I have  discussed alternative options, risks & benefits,  which include, but are not limited to, bleeding, infection, perforation,respiratory complication & drug reaction.  The patient agrees with this plan & written consent will be obtained.    Please note the patient had significant hypokalemia on admission, with potassium of 2.6.  However, this has improved with replacement and is 3.2 today.  Usually above 3 is appropriate for EGD from an anesthesia standpoint.  As long as it remains stable around this level, would be okay to proceed with EGD.    Patient had an echo today with results now available showing normal EF. Therefore appropriate to proceed with EGD from this standpoint  EGD tomorrow with Dr. Servando Snare, who would evaluate the patient prior to the procedure tomorrow to ensure ok to proceed from hypokalemia and anesthesia standpoint.   Thank you for involving me in the care of this patient.      LOS: 4 days   Pasty Spillers, MD  12/28/2020, 1:55 PM

## 2020-12-28 NOTE — Evaluation (Signed)
Physical Therapy Evaluation Patient Details Name: David Watts MRN: [id number] DOB: October 28, 1975 Today's Date: 12/28/2020   History of Present Illness  Pt is a 45 y.o. M admitted for new onset DM2 with DKA and persistent hypokalemia with no significant PMH.  Clinical Impression  Pt alert, motivated for therapy. Pt notes PLOF as independent with all IADLs, ADLs and currently works. Pt lives alone but plans to stay with his brother at discharge intermittently for support as needed. Family is able to assist 24/7 as needed.  Pt transfers with supervision secondary to lightheadedness BP (seated):132/81, (standing):135/82 and blurry vision. No instability or LOB noted with transfers. Ambulated with min-guard for safety and decreased vision. Pt requires tactile and verbal guidance when maneuvering around tight spaces and increased time. Increased SOB noted when walking requiring several rest breaks. HR 100 at rest, HR 115-120 w/ambulation. HHPT is recommended due to change in PLOF and pt being able to reside with family for support.     Follow Up Recommendations Home health PT    Equipment Recommendations  None recommended by PT    Recommendations for Other Services       Precautions / Restrictions Precautions Precautions: Fall Restrictions Weight Bearing Restrictions: No      Mobility  Bed Mobility Overal bed mobility: Modified Independent             General bed mobility comments: Pt utilizes bed rails w/ HOB elevated for supine > sit    Transfers Overall transfer level: Needs assistance Equipment used: None Transfers: Sit to/from Stand Sit to Stand: Supervision         General transfer comment: Supervision for safety due to sx of lightheadedness and increased visual blurriness  Ambulation/Gait Ambulation/Gait assistance: Min guard Gait Distance (Feet): 140 Feet Assistive device: None Gait Pattern/deviations: Step-through pattern;Decreased stride length Gait  velocity: Decreased   General Gait Details: Pt demonstrated increased SOB upon exertion requiring several rest breaks. Pt also notes visual bluriness and requires tactile guidance when walking near objects or reaches out to wall for support intermittently.  Stairs            Wheelchair Mobility    Modified Rankin (Stroke Patients Only)       Balance Overall balance assessment: Needs assistance Sitting-balance support: Feet supported;No upper extremity supported Sitting balance-Leahy Scale: Good       Standing balance-Leahy Scale: Fair Standing balance comment: Pt relies on counter or wall when upright, although is able to stand statically without LOB or support when requested                             Pertinent Vitals/Pain Pain Assessment: No/denies pain    Home Living Family/patient expects to be discharged to:: Private residence Living Arrangements: Alone Available Help at Discharge: Family (plans to reside at brother's house at discharge) Type of Home: House Home Access: Stairs to enter Entrance Stairs-Rails: Left Entrance Stairs-Number of Steps: 2 Home Layout: One level Home Equipment: None      Prior Function Level of Independence: Independent         Comments: Pt currently works, drives, and independent with all ADLs, IADLs.     Hand Dominance   Dominant Hand: Right    Extremity/Trunk Assessment   Upper Extremity Assessment Upper Extremity Assessment: Overall WFL for tasks assessed    Lower Extremity Assessment Lower Extremity Assessment: Overall WFL for tasks assessed (SILT to dorsal,plantar aspect bilateral feet and BLE)  Cervical / Trunk Assessment Cervical / Trunk Assessment: Normal  Communication   Communication: No difficulties  Cognition Arousal/Alertness: Awake/alert Behavior During Therapy: WFL for tasks assessed/performed Overall Cognitive Status: Within Functional Limits for tasks assessed                                  General Comments: AOx3      General Comments      Exercises Other Exercises Other Exercises: Bed > BSC w/ supervision due to sx of lightheadedness   Assessment/Plan    PT Assessment Patient needs continued PT services  PT Problem List Decreased strength;Decreased range of motion;Decreased activity tolerance;Decreased balance;Decreased mobility;Decreased coordination       PT Treatment Interventions Gait training;Stair training;Functional mobility training;Therapeutic activities;Therapeutic exercise;Balance training;Neuromuscular re-education    PT Goals (Current goals can be found in the Care Plan section)  Acute Rehab PT Goals Patient Stated Goal: To walk PT Goal Formulation: With patient Time For Goal Achievement: 01/11/21 Potential to Achieve Goals: Good Additional Goals Additional Goal #1: Pt will perform a 5xSTS under 12 seconds without the need for support to improve endurance and strength necessary for ADLs and returning to PLOF.    Frequency Min 2X/week   Barriers to discharge        Co-evaluation               AM-PAC PT "6 Clicks" Mobility  Outcome Measure Help needed turning from your back to your side while in a flat bed without using bedrails?: None Help needed moving from lying on your back to sitting on the side of a flat bed without using bedrails?: A Little Help needed moving to and from a bed to a chair (including a wheelchair)?: A Little Help needed standing up from a chair using your arms (e.g., wheelchair or bedside chair)?: A Little Help needed to walk in hospital room?: A Little Help needed climbing 3-5 steps with a railing? : A Little 6 Click Score: 19    End of Session Equipment Utilized During Treatment: Gait belt Activity Tolerance: Patient tolerated treatment well Patient left: in chair;with call bell/phone within reach;with chair alarm set;with nursing/sitter in room   PT Visit Diagnosis: Unsteadiness on  feet (R26.81);Muscle weakness (generalized) (M62.81)    Time: 9678-9381 PT Time Calculation (min) (ACUTE ONLY): 38 min   Charges:              [employer], SPT

## 2020-12-28 NOTE — Progress Notes (Signed)
Inpatient Diabetes Program Recommendations  AACE/ADA: New Consensus Statement on Inpatient Glycemic Control   Target Ranges:  Prepandial:   less than 140 mg/dL      Peak postprandial:   less than 180 mg/dL (1-2 hours)      Critically ill patients:  140 - 180 mg/dL  Results for David Watts, David Watts (MRN [id number]) as of 12/28/2020 13:38  Ref. Range 12/28/2020 09:18 12/28/2020 12:20  Glucose-Capillary Latest Ref Range: 70 - 99 mg/dL 250 (H) 322 (H)   Results for David Watts, David Watts (MRN [id number]) as of 12/28/2020 07:26  Ref. Range 12/28/2020 05:58  Glucose Latest Ref Range: 70 - 99 mg/dL 244 (H)   Results for David Watts, David Watts (MRN [id number]) as of 12/28/2020 07:26  Ref. Range 12/27/2020 07:43 12/27/2020 08:57 12/27/2020 11:39 12/27/2020 16:29 12/27/2020 20:43  Glucose-Capillary Latest Ref Range: 70 - 99 mg/dL 271 (H) 280 (H)  Novolog 14 units  Semglee 30 units 260 (H)  Novolog 12 units 229 (H)  Novolog 5 units 254 (H)  Novolog 3 units   Results for David Watts, David Watts (MRN [id number]) as of 12/28/2020 07:26  Ref. Range 12/24/2020 05:22  Hemoglobin A1C Latest Ref Range: 4.8 - 5.6 % 14.2 (H)   Review of Glycemic Control  Current orders for Inpatient glycemic control: Semglee 25 units BID, Novolog 10 units TID with melas, Novolog 0-15 units TID with meals, Novolog 0-5 units QHS  Inpatient Diabetes Program Recommendations:    Insulin: Noted Semglee increased to 25 units BID.  Outpatient DM management: At time of discharge, please provide Rx for glucose monitoring kit (#[id number]), insulin pens (insurance covers Lantus SoloStar [phone]) and Tobie Lords [phone]), insulin pen needles [phone]), and FreeStyle Libre2 sensors [phone]).  NOTE: Spoke with patient and his sister at bedside. Patient sitting up in a chair and said he feels much better but still having burning/indigestion with about everything except milk and ensure. Patient states that even water is burning badly. He notes that he has been told he  has an ulcer which is being treated but he may have endoscopy procedure in the next couple of days. Patient reports that he had an Ensure plus (has 51 grams of carbs) this morning around 5:30-6 am. Patient states he had low carb Ensure yesterday and he tolerated it fairly well but he does not want to drink the Ensure Plus he was given this afternoon because his glucose is back up over 300 mg/dl before lunch. Patient states he has not been able to eat much solid food at all yet and he is scared to eat because of the burning/indigestion pain he gets with eating. Patient states that he has been up walking today and overall feels better and like he has more energy. Patient concerned that glucose is back up over 300 mg/dl before noon. Patient was told that they did not have Ensure Max on floor today; informed patient I would reach out to RD to be sure he had Ensure Max ordered.  Explained that insulin was adjusted today and it will continue to be adjusted as glucose trends are followed while inpatient.  Patient notes he would still like to use insulin pens as an outpatient. Informed patient that according to chart, it appears that Lantus and Novolog insulins are preferred with his insurance. Reviewed Lantus and Novolog insulin. Patient would still like to use FreeStyle Libre2 CGM as an outpatient as well. Discussed FreeStyle Libre2 CGM, how to apply, how to scan, and use the app or reader device. Patient did not want  to start a sensor yet because he would have to remove it if he had any radiology procedures. Encouraged patient and his sister to watch youtube video on how to apply and use the [employer] as well. Patient states that he has been told he would likely still be inpatient next week so inpatient diabetes team will follow up with him on Monday if he is still inpatient.  Provided patient with FreeStyle Libre2 sensors (3) and a FreeStyle Libre2 reader.  Encouraged patient to be sure to share FreeStyle Libre2  information with Dr. Gabriel Carina when he sees her regarding DM management.  Patient appreciative of samples and verbalized understanding of information discussed.      Thanks, Barnie Alderman, RN, MSN, CDE Diabetes Coordinator Inpatient Diabetes Program [phone] (Team Pager from 8am to 5pm)

## 2020-12-28 NOTE — Plan of Care (Signed)
  RD consulted for nutrition education regarding new onset diabetes.   Lab Results  Component Value Date   HGBA1C 14.2 (H) 12/24/2020    RD provided "Carbohydrate Counting for People with Diabetes" handout from the Academy of Nutrition and Dietetics. Discussed different food groups and their effects on blood sugar, emphasizing carbohydrate-containing foods. Provided list of carbohydrates and recommended serving sizes of common foods.  Discussed importance of controlled and consistent carbohydrate intake throughout the day. Provided examples of ways to balance meals/snacks and encouraged intake of high-fiber, whole grain complex carbohydrates. Teach back method used.  Expect good compliance.  Body mass index is 34.87 kg/m. Pt meets criteria for obesity class I based on current BMI.  Current diet order is full liquid. Labs and medications reviewed. No further nutrition interventions warranted at this time. RD contact information provided. Will follow-up with pt as planned next week if pt remains inpatient.  Greig Castilla, RD, LDN Clinical Dietitian Pager on Amion

## 2020-12-28 NOTE — Progress Notes (Signed)
Patient continuing to refuse to allow nursing to use PIV in hand. Requested PIV to right arm be removed due to "potassium burning" despite running it over 4 hours. PIV removed and new IV attempts made per request by SWOT nurse. Unable to gain access. IV team consult placed.

## 2020-12-28 NOTE — Progress Notes (Signed)
PT Cancellation Note  Patient Details Name: David Watts MRN: [id number] DOB: [birth year]   Cancelled Treatment:    Reason Eval/Treat Not Completed: Other (comment) Pt requested time for eating breakfast. PT to reassess as able.   [employer], SPT

## 2020-12-28 NOTE — Progress Notes (Signed)
PROGRESS NOTE    David Watts  MBT:[id number] DOB: 1976-01-23 DOA: 12/23/2020 PCP: Merryl Hacker, No    Brief Narrative:  David Watts is a 45 y.o. male with No significant past medical history who presents to the ED with a 1 week history of generalized malaise, epigastric and chest pain as well as nonbloody nonbilious vomiting.  Patient had a 1 week history of abdominal cramping pain, nausea vomiting, has not been able to eat.  No bowel movement for 1 week. He also is a chronic smoker, he has some shortness of breath. Arriving the hospital, patient was a found to have new onset diabetes with diabetes ketoacidosis.  He is placed on insulin drip. Patient was able to change to subcu insulin on 8/10.  He has persistent hypokalemia, requiring large amount of IV infusion.   Assessment & Plan:   Principal Problem:   DKA (diabetic ketoacidosis), new onset Active Problems:   URI (upper respiratory infection)   AKI (acute kidney injury) (HCC)   Lactic acidosis   NASH (nonalcoholic steatohepatitis)   Reactive thrombocytosis  #1.  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 Acute kidney injury secondary to diabetes ketoacidosis. Lactic acidosis secondary to dehydration Condition had improved, continue Lantus and scheduled short acting insulin and sliding scale insulin.  Patient currently on liquid diet due to severe esophageal ulceration. Insulin dose increased.  2.  Hyponatremia Severe hypokalemia. Hypophosphatemia. Sodium level better.  We will continue give potassium and phosphate.  Also added Aldactone.  3.  Right upper abdominal pain. Dysphagia with severe heartburn. Esophageal ulceration. Abdominal pain has resolved, patient still has significant heartburn, but better after starting liquid diet.  Continue Protonix, sucralfate, Maalox and Tums. GI is scheduled for EGD on Monday. Patient condition most likely due to Mallory-Weiss tear.  #4.  Shortness of breath. Patient does  not seem to have any volume overload, continue as needed Combivent. BNP not elevated, echocardiogram performed, pending results.  5.  Reactive thrombocytosis. Continue to follow-up  6.  Scalp abscess. Will complete 5 days of oral antibiotics.  Condition improving.   DVT prophylaxis: Lovenox Code Status: full Family Communication: None Disposition Plan:    Status is: Inpatient  Remains inpatient appropriate because:Persistent severe electrolyte disturbances, IV treatments appropriate due to intensity of illness or inability to take PO, and Inpatient level of care appropriate due to severity of illness  Dispo: The patient is from: Home              Anticipated d/c is to: Home              Patient currently is not medically stable to d/c.   Difficult to place patient No        I/O last 3 completed shifts: In: 1088.1 [P.O.:840; IV Piggyback:248.1] Out: 2975 [Urine:2975] Total I/O In: 480 [P.O.:480] Out: 500 [Urine:500]     Consultants:  GI  Procedures: None  Antimicrobials: (None  Subjective: Patient feels much better today, right upper quadrant abdominal pain has resolved.  Heartburn much improved after starting liquid diet.  Patient appetite improving. Denies any short of breath or cough today. No chest pain palpitations No fever chills No headache or dizziness.  Objective: Vitals:   12/27/20 1554 12/27/20 2042 12/28/20 0454 12/28/20 0917  BP: 121/81 134/85 (!) 144/87 128/78  Pulse: 90 93 90 99  Resp: $Remo'19 16 16 18  'yCSlY$ Temp: 97.6 F (36.4 C) 98.5 F (36.9 C) 98.4 F (36.9 C) (!) 97.5 F (36.4 C)  TempSrc:  Oral  SpO2: 99% 98% 97% 98%  Weight:      Height:        Intake/Output Summary (Last 24 hours) at 12/28/2020 1421 Last data filed at 12/28/2020 1300 Gross per 24 hour  Intake 1448.13 ml  Output 1450 ml  Net -1.87 ml   Filed Weights   12/23/20 2210  Weight: 113.4 kg    Examination:  General exam: Appears calm and comfortable  Respiratory  system: Clear to auscultation. Respiratory effort normal. Cardiovascular system: S1 & S2 heard, RRR. No JVD, murmurs, rubs, gallops or clicks. No pedal edema. Gastrointestinal system: Abdomen is nondistended, soft and nontender. No organomegaly or masses felt. Normal bowel sounds heard. Central nervous system: Alert and oriented. No focal neurological deficits. Extremities: Symmetric 5 x 5 power. Skin: No rashes, lesions or ulcers Psychiatry: Judgement and insight appear normal. Mood & affect appropriate.     Data Reviewed: I have personally reviewed following labs and imaging studies  CBC: Recent Labs  Lab 12/23/20 2251 12/25/20 0437 12/26/20 0447 12/28/20 0558  WBC 13.3* 10.7* 14.0* 14.2*  NEUTROABS  --  7.8* 9.8* 8.8*  HGB 15.1 12.1* 12.0* 10.6*  HCT 42.3 33.0* 32.2* 28.8*  MCV 80.3 78.8* 76.7* 78.0*  PLT 465* 433* 447* 341   Basic Metabolic Panel: Recent Labs  Lab 12/24/20 1029 12/24/20 1421 12/25/20 0437 12/25/20 0900 12/25/20 1650 12/25/20 2248 12/26/20 0447 12/26/20 1122 12/27/20 0601 12/27/20 1116 12/27/20 1707 12/27/20 2302 12/28/20 0558 12/28/20 1051  NA 124*   < > 129*   < > 130* 131* 134*  --  133*  --   --   --  136  --   K 2.3*   < > 2.7*   < > 2.6* 2.7* 2.7*   < > 3.2* 3.2* 3.2* 3.3* 3.1* 3.2*  CL 104   < > 106   < > 110 108 109  --  106  --   --   --  104  --   CO2 12*   < > 15*   < > 15* 20* 19*  --  20*  --   --   --  24  --   GLUCOSE 225*   < > 185*   < > 211* 179* 162*  --  293*  --   --   --  244*  --   BUN 20   < > 14   < > $R'9 9 8  'IW$ --  10  --   --   --  11  --   CREATININE 1.24   < > 1.38*   < > 1.23 1.26* 1.28*  --  1.17  --   --   --  1.18  --   CALCIUM 9.4   < > 9.9   < > 9.9 10.0 9.9  --  9.3  --   --   --  8.7*  --   MG 2.0  --  2.0  --   --   --  1.9  --  1.8  --   --   --  1.6*  --   PHOS  --   --   --   --   --   --  <1.0*  --  1.5*  --   --   --  1.9*  --    < > = values in this interval not displayed.   GFR: Estimated Creatinine  Clearance: 103.3 mL/min (by C-G formula based  on SCr of 1.18 mg/dL). Liver Function Tests: Recent Labs  Lab 12/23/20 2251  AST 30  ALT 24  ALKPHOS 114  BILITOT 2.1*  PROT 7.0  ALBUMIN 2.7*   Recent Labs  Lab 12/24/20 0522 12/26/20 1700  LIPASE 199* 85*   No results for input(s): AMMONIA in the last 168 hours. Coagulation Profile: No results for input(s): INR, PROTIME in the last 168 hours. Cardiac Enzymes: No results for input(s): CKTOTAL, CKMB, CKMBINDEX, TROPONINI in the last 168 hours. BNP (last 3 results) No results for input(s): PROBNP in the last 8760 hours. HbA1C: No results for input(s): HGBA1C in the last 72 hours. CBG: Recent Labs  Lab 12/27/20 1139 12/27/20 1629 12/27/20 2043 12/28/20 0918 12/28/20 1220  GLUCAP 260* 229* 254* 250* 322*   Lipid Profile: No results for input(s): CHOL, HDL, LDLCALC, TRIG, CHOLHDL, LDLDIRECT in the last 72 hours. Thyroid Function Tests: No results for input(s): TSH, T4TOTAL, FREET4, T3FREE, THYROIDAB in the last 72 hours. Anemia Panel: No results for input(s): VITAMINB12, FOLATE, FERRITIN, TIBC, IRON, RETICCTPCT in the last 72 hours. Sepsis Labs: Recent Labs  Lab 12/23/20 2251 12/24/20 0126  LATICACIDVEN 2.8* 2.2*    Recent Results (from the past 240 hour(s))  Resp Panel by RT-PCR (Flu A&B, Covid) Nasopharyngeal Swab     Status: None   Collection Time: 12/23/20 10:51 PM   Specimen: Nasopharyngeal Swab; Nasopharyngeal(NP) swabs in vial transport medium  Result Value Ref Range Status   SARS Coronavirus 2 by RT PCR NEGATIVE NEGATIVE Final    Comment: (NOTE) SARS-CoV-2 target nucleic acids are NOT DETECTED.  The SARS-CoV-2 RNA is generally detectable in upper respiratory specimens during the acute phase of infection. The lowest concentration of SARS-CoV-2 viral copies this assay can detect is 138 copies/mL. A negative result does not preclude SARS-Cov-2 infection and should not be used as the sole basis for treatment  or other patient management decisions. A negative result may occur with  improper specimen collection/handling, submission of specimen other than nasopharyngeal swab, presence of viral mutation(s) within the areas targeted by this assay, and inadequate number of viral copies(<138 copies/mL). A negative result must be combined with clinical observations, patient history, and epidemiological information. The expected result is Negative.  Fact Sheet for Patients:  EntrepreneurPulse.com.au  Fact Sheet for Healthcare Providers:  IncredibleEmployment.be  This test is no t yet approved or cleared by the [place] FDA and  has been authorized for detection and/or diagnosis of SARS-CoV-2 by FDA under an Emergency Use Authorization (EUA). This EUA will remain  in effect (meaning this test can be used) for the duration of the COVID-19 declaration under Section 564(b)(1) of the Act, 21 U.S.C.section 360bbb-3(b)(1), unless the authorization is terminated  or revoked sooner.       Influenza A by PCR NEGATIVE NEGATIVE Final   Influenza B by PCR NEGATIVE NEGATIVE Final    Comment: (NOTE) The Xpert Xpress SARS-CoV-2/FLU/RSV plus assay is intended as an aid in the diagnosis of influenza from Nasopharyngeal swab specimens and should not be used as a sole basis for treatment. Nasal washings and aspirates are unacceptable for Xpert Xpress SARS-CoV-2/FLU/RSV testing.  Fact Sheet for Patients: EntrepreneurPulse.com.au  Fact Sheet for Healthcare Providers: IncredibleEmployment.be  This test is not yet approved or cleared by the [place] FDA and has been authorized for detection and/or diagnosis of SARS-CoV-2 by FDA under an Emergency Use Authorization (EUA). This EUA will remain in effect (meaning this test can be used) for the duration of  the COVID-19 declaration under Section 564(b)(1) of the Act, 21 U.S.C. section  360bbb-3(b)(1), unless the authorization is terminated or revoked.  Performed at [facility], [address] [place], [place] [address]   Aerobic/Anaerobic Culture w Gram Stain (surgical/deep wound)     Status: None (Preliminary result)   Collection Time: 12/24/20  2:21 PM   Specimen: SCALP; Abscess  Result Value Ref Range Status   Specimen Description   Final    SCALP Performed at [facility], [address], [place], [place] [address]    Special Requests   Final    NONE Performed at [facility], [address] [place], [place] [address]    Gram Stain   Final    MODERATE WBC PRESENT, PREDOMINANTLY PMN ABUNDANT GRAM POSITIVE COCCI Performed at [facility], [place] [address] [place], [place] [address]    Culture   Final    ABUNDANT STAPHYLOCOCCUS LUGDUNENSIS NO ANAEROBES ISOLATED; CULTURE IN PROGRESS FOR 5 DAYS    Report Status PENDING  Incomplete   Organism ID, Bacteria STAPHYLOCOCCUS LUGDUNENSIS  Final      Susceptibility   Staphylococcus lugdunensis - MIC*    CIPROFLOXACIN <=0.5 SENSITIVE Sensitive     ERYTHROMYCIN <=0.25 SENSITIVE Sensitive     GENTAMICIN <=0.5 SENSITIVE Sensitive     OXACILLIN 0.5 SENSITIVE Sensitive     TETRACYCLINE <=1 SENSITIVE Sensitive     VANCOMYCIN <=0.5 SENSITIVE Sensitive     TRIMETH/SULFA <=10 SENSITIVE Sensitive     CLINDAMYCIN <=0.25 SENSITIVE Sensitive     RIFAMPIN <=0.5 SENSITIVE Sensitive     Inducible Clindamycin NEGATIVE Sensitive     * ABUNDANT STAPHYLOCOCCUS LUGDUNENSIS  MRSA Next Gen by PCR, Nasal     Status: None   Collection Time: 12/24/20  6:58 PM   Specimen: Nasal Mucosa; Nasal Swab  Result Value Ref Range Status   MRSA by PCR Next Gen NOT DETECTED NOT DETECTED Final    Comment: (NOTE) The GeneXpert MRSA Assay (FDA approved for NASAL specimens only), is one component of a comprehensive MRSA colonization surveillance program. It is not intended to diagnose MRSA infection nor to  guide or monitor treatment for MRSA infections. Test performance is not FDA approved in patients less than 73 years old. Performed at [facility], [address] [place], [place] [address]          Radiology Studies: DG ESOPHAGUS W SINGLE CM (SOL OR THIN BA)  Result Date: 12/27/2020 CLINICAL DATA:  Epigastric pain and dysphagia EXAM: ESOPHOGRAM/BARIUM SWALLOW TECHNIQUE: Single contrast examination was performed using  thin barium. FLUOROSCOPY TIME:  Fluoroscopy Time:  1 minutes 48 seconds Radiation Exposure Index (if provided by the fluoroscopic device): 30.7 mGy Number of Acquired Spot Images: 3 COMPARISON:  None. FINDINGS: Limited supine LPO esophagram due to patient condition. Mild esophageal dysmotility. No obstruction to the forward flow of contrast throughout the esophagus and into the stomach. There is evidence of a broad-based ulcer in the lower esophagus measuring up to 2.7 cm in length. Small sliding hiatal hernia. No gastroesophageal reflux occurred during the exam. The exam was stopped due to patient tolerance and condition. The patient had severe burning sensation after swallowing the barium. IMPRESSION: Evidence of a broad-based ulcer in the lower esophagus measuring up to 2.7 cm. Recommend correlation with endoscopy. Mild esophageal dysmotility. Small sliding-type hiatal hernia. No gastroesophageal reflux occurred during the exam. Limited esophagram due to patient condition. Patient had a severe burning sensation after swallowing barium.  Electronically Signed   By: Maurine Simmering M.D.   On: 12/27/2020 15:22        Scheduled Meds:  amoxicillin-clavulanate  1 tablet Oral Q12H   Chlorhexidine Gluconate Cloth  6 each Topical Daily   doxycycline  100 mg Oral Q12H   enoxaparin (LOVENOX) injection  0.5 mg/kg Subcutaneous Q24H   insulin aspart  0-15 Units Subcutaneous TID WC   insulin aspart  0-5 Units Subcutaneous QHS   insulin aspart  10 Units Subcutaneous TID WC    [START ON 12/29/2020] insulin glargine-yfgn  25 Units Subcutaneous BID   insulin starter kit- pen needles  1 kit Other Once   lidocaine  1 patch Transdermal Q24H   multivitamin with minerals  1 tablet Oral Daily   pantoprazole  40 mg Oral BID   pneumococcal 23 valent vaccine  0.5 mL Intramuscular Tomorrow-1000   polyethylene glycol  17 g Oral Daily   Ensure Max Protein  11 oz Oral BID   senna-docusate  2 tablet Oral BID   spironolactone  25 mg Oral Daily   sucralfate  1 g Oral TID WC & HS   Continuous Infusions:  magnesium sulfate bolus IVPB     potassium chloride     potassium PHOSPHATE IVPB (in mmol)       LOS: 4 days    Time spent: 32 minutes    Sharen Hones, MD [facility]   To contact the attending provider between 7A-7P or the covering provider during after hours 7P-7A, please log into the web site [email or web address] and access using universal [place] password for that web site. If you do not have the password, please call the hospital operator.  12/28/2020, 2:21 PM

## 2020-12-28 NOTE — Progress Notes (Signed)
*  PRELIMINARY RESULTS* Echocardiogram 2D Echocardiogram has been performed.  David Watts 12/28/2020, 8:28 AM

## 2020-12-29 ENCOUNTER — Inpatient Hospital Stay: Payer: BC Managed Care – PPO | Admitting: Pediatrics

## 2020-12-29 ENCOUNTER — Encounter
Admission: EM | Disposition: A | Discharge status: Home / Self Care | Payer: Self-pay | Source: Home / Self Care | Attending: Internal Medicine

## 2020-12-29 ENCOUNTER — Encounter: Payer: Self-pay | Admitting: Internal Medicine

## 2020-12-29 DIAGNOSIS — R1319 Other dysphagia: Secondary | ICD-10-CM

## 2020-12-29 DIAGNOSIS — R1310 Dysphagia, unspecified: Secondary | ICD-10-CM

## 2020-12-29 DIAGNOSIS — E871 Hypo-osmolality and hyponatremia: Secondary | ICD-10-CM

## 2020-12-29 HISTORY — PX: ESOPHAGOGASTRODUODENOSCOPY: SHX5428

## 2020-12-29 LAB — AEROBIC/ANAEROBIC CULTURE W GRAM STAIN (SURGICAL/DEEP WOUND)

## 2020-12-29 LAB — BASIC METABOLIC PANEL
Anion gap: 8 (ref 5–15)
BUN: 11 mg/dL (ref 6–20)
CO2: 27 mmol/L (ref 22–32)
Calcium: 8.2 mg/dL — ABNORMAL LOW (ref 8.9–10.3)
Chloride: 102 mmol/L (ref 98–111)
Creatinine, Ser: 1 mg/dL (ref 0.61–1.24)
GFR, Estimated: >60 mL/min (ref >60)
Glucose, Bld: 220 mg/dL — ABNORMAL HIGH (ref 70–99)
Potassium: 4 mmol/L (ref 3.5–5.1)
Sodium: 137 mmol/L (ref 135–145)

## 2020-12-29 LAB — POTASSIUM: Potassium: 3.8 mmol/L (ref 3.5–5.1)

## 2020-12-29 LAB — GLUCOSE, CAPILLARY
Glucose-Capillary: 177 mg/dL — ABNORMAL HIGH (ref 70–99)
Glucose-Capillary: 204 mg/dL — ABNORMAL HIGH (ref 70–99)
Glucose-Capillary: 178 mg/dL — ABNORMAL HIGH (ref 70–99)
Glucose-Capillary: 183 mg/dL — ABNORMAL HIGH (ref 70–99)

## 2020-12-29 LAB — PHOSPHORUS: Phosphorus: 2.4 mg/dL — ABNORMAL LOW (ref 2.5–4.6)

## 2020-12-29 LAB — MAGNESIUM: Magnesium: 2 mg/dL (ref 1.7–2.4)

## 2020-12-29 SURGERY — EGD (ESOPHAGOGASTRODUODENOSCOPY)
Anesthesia: General

## 2020-12-29 MED ORDER — PROPOFOL 500 MG/50ML IV EMUL
INTRAVENOUS | Status: AC
  Filled 2020-12-29: qty 50

## 2020-12-29 MED ORDER — PROPOFOL 500 MG/50ML IV EMUL
INTRAVENOUS | Status: DC | PRN
Start: 2020-12-29 — End: 2020-12-29
  Administered 2020-12-29 (×2): 50 ug via INTRAVENOUS

## 2020-12-29 MED ORDER — LIDOCAINE HCL (CARDIAC) PF 100 MG/5ML IV SOSY
PREFILLED_SYRINGE | INTRAVENOUS | Status: DC | PRN
  Administered 2020-12-29: 200 mg via INTRAVENOUS

## 2020-12-29 MED ORDER — SODIUM CHLORIDE 0.9 % IV SOLN: INTRAVENOUS | Status: DC

## 2020-12-29 MED ORDER — LIDOCAINE HCL (PF) 2 % IJ SOLN
INTRAMUSCULAR | Status: AC
  Filled 2020-12-29: qty 10

## 2020-12-29 MED ORDER — SPIRONOLACTONE 25 MG PO TABS
25.0000 mg | ORAL_TABLET | Freq: Every day | ORAL | Status: DC
  Administered 2020-12-30: 08:00:00 25 mg via ORAL
  Filled 2020-12-29 (×2): qty 1

## 2020-12-29 MED ORDER — POTASSIUM & SODIUM PHOSPHATES 280-160-250 MG PO PACK
2.0000 | PACK | Freq: Three times a day (TID) | ORAL | Status: DC
  Administered 2020-12-29 (×3): 2 via ORAL
  Filled 2020-12-29 (×5): qty 2

## 2020-12-29 NOTE — Transfer of Care (Signed)
Immediate Anesthesia Transfer of Care Note  Patient: David Watts  Procedure(s) Performed: ESOPHAGOGASTRODUODENOSCOPY (EGD)  Patient Location: PACU and Endoscopy Unit  Anesthesia Type:MAC  Level of Consciousness: awake, alert  and oriented  Airway & Oxygen Therapy: Patient Spontanous Breathing and Patient connected to nasal cannula oxygen  Post-op Assessment: Report given to RN and Post -op Vital signs reviewed and stable  Post vital signs: Reviewed and stable  Last Vitals:  Vitals Value Taken Time  BP 96/58   Temp    Pulse 92   Resp 16   SpO2 100     Last Pain:  Vitals:   12/29/20 0759  TempSrc: Oral  PainSc: 0-No pain      Patients Stated Pain Goal: 2 (28/20/60 1561)  Complications: No notable events documented.

## 2020-12-29 NOTE — Progress Notes (Signed)
PROGRESS NOTE    David Watts  ZOX:[id number] DOB: 08/19/1975 DOA: 12/23/2020 PCP: Merryl Hacker, No    Brief Narrative:  David Watts is a 45 y.o. male with No significant past medical history who presents to the ED with a 1 week history of generalized malaise, epigastric and chest pain as well as nonbloody nonbilious vomiting.  Patient had a 1 week history of abdominal cramping pain, nausea vomiting, has not been able to eat.  No bowel movement for 1 week. He also is a chronic smoker, he has some shortness of breath. Arriving the hospital, patient was a found to have new onset diabetes with diabetes ketoacidosis.  He is placed on insulin drip. Patient was able to change to subcu insulin on 8/10.  He has persistent hypokalemia, requiring large amount of IV infusion.   Assessment & Plan:   Principal Problem:   DKA (diabetic ketoacidosis), new onset Active Problems:   URI (upper respiratory infection)   AKI (acute kidney injury) (HCC)   Lactic acidosis   NASH (nonalcoholic steatohepatitis)   Reactive thrombocytosis   Hyponatremia  #1.  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 Acute kidney injury secondary to diabetes ketoacidosis. Lactic acidosis secondary to dehydration Condition improved.  Continue current Lantus and NovoLog and sliding scale insulin.  2.  Hyponatremia Severe hypokalemia. Hypophosphatemia. Potassium finally normalized.  I will decrease dose Aldactone to 25 mg daily.  Also give oral Neutra-Phos.  3.  Right upper abdominal pain. Dysphagia with severe heartburn. Esophageal ulceration. Patient is getting EGD today.  Symptom is getting better.  4.  Shortness of breath. Condition seem to be improving, partly this is due to COPD.  No evidence of congestive heart failure.  Echocardiogram showed normal ejection fraction without valvular changes.  No hypertrophic cardiomyopathy.  #5. Scalp abscess. Improving.  Completed antibiotics     DVT prophylaxis:  Lovenox Code Status: full Family Communication: Sister updated. Disposition Plan:    Status is: Inpatient  Remains inpatient appropriate because:Inpatient level of care appropriate due to severity of illness  Dispo: The patient is from: Home              Anticipated d/c is to: Home              Patient currently is not medically stable to d/c.   Difficult to place patient No        I/O last 3 completed shifts: In: 1880 [P.O.:1880] Out: 2020 [Urine:2020] Total I/O In: -  Out: 500 [Urine:500]     Consultants:  GI  Procedures: None  Antimicrobials: None  Subjective: Patient feels much improved.  Heartburn is better, still on full liquid diet. Denies any chest pain palpitation. No abdominal pain or nausea vomiting. No dysuria or hematuria.  Objective: Vitals:   12/28/20 2048 12/29/20 0547 12/29/20 0759 12/29/20 1123  BP: 129/85 135/84 116/76 117/72  Pulse: 94 86 89 82  Resp: 16 16 (!) 21 19  Temp: 98.4 F (36.9 C) 98.5 F (36.9 C) 97.8 F (36.6 C) 98.7 F (37.1 C)  TempSrc: Oral  Oral   SpO2: 98% 97% 96% 96%  Weight:      Height:        Intake/Output Summary (Last 24 hours) at 12/29/2020 1225 Last data filed at 12/29/2020 0800 Gross per 24 hour  Intake 680 ml  Output 1920 ml  Net -1240 ml   Filed Weights   12/23/20 2210  Weight: 113.4 kg    Examination:  General exam: Appears  calm and comfortable  Respiratory system: Clear to auscultation. Respiratory effort normal. Cardiovascular system: S1 & S2 heard, RRR. No JVD, murmurs, rubs, gallops or clicks. No pedal edema. Gastrointestinal system: Abdomen is nondistended, soft and nontender. No organomegaly or masses felt. Normal bowel sounds heard. Central nervous system: Alert and oriented. No focal neurological deficits. Extremities: Symmetric 5 x 5 power. Skin: No rashes, lesions or ulcers Psychiatry: Judgement and insight appear normal. Mood & affect appropriate.     Data Reviewed: I have  personally reviewed following labs and imaging studies  CBC: Recent Labs  Lab 12/23/20 2251 12/25/20 0437 12/26/20 0447 12/28/20 0558  WBC 13.3* 10.7* 14.0* 14.2*  NEUTROABS  --  7.8* 9.8* 8.8*  HGB 15.1 12.1* 12.0* 10.6*  HCT 42.3 33.0* 32.2* 28.8*  MCV 80.3 78.8* 76.7* 78.0*  PLT 465* 433* 447* 888   Basic Metabolic Panel: Recent Labs  Lab 12/25/20 0437 12/25/20 0900 12/25/20 2248 12/26/20 0447 12/26/20 1122 12/27/20 0601 12/27/20 1116 12/28/20 0558 12/28/20 1051 12/28/20 1707 12/28/20 2236 12/29/20 0539  NA 129*   < > 131* 134*  --  133*  --  136  --   --   --  137  K 2.7*   < > 2.7* 2.7*   < > 3.2*   < > 3.1* 3.2* 3.2* 3.8 4.0  CL 106   < > 108 109  --  106  --  104  --   --   --  102  CO2 15*   < > 20* 19*  --  20*  --  24  --   --   --  27  GLUCOSE 185*   < > 179* 162*  --  293*  --  244*  --   --   --  220*  BUN 14   < > 9 8  --  10  --  11  --   --   --  11  CREATININE 1.38*   < > 1.26* 1.28*  --  1.17  --  1.18  --   --   --  1.00  CALCIUM 9.9   < > 10.0 9.9  --  9.3  --  8.7*  --   --   --  8.2*  MG 2.0  --   --  1.9  --  1.8  --  1.6*  --   --   --  2.0  PHOS  --   --   --  <1.0*  --  1.5*  --  1.9*  --   --   --  2.4*   < > = values in this interval not displayed.   GFR: Estimated Creatinine Clearance: 121.9 mL/min (by C-G formula based on SCr of 1 mg/dL). Liver Function Tests: Recent Labs  Lab 12/23/20 2251  AST 30  ALT 24  ALKPHOS 114  BILITOT 2.1*  PROT 7.0  ALBUMIN 2.7*   Recent Labs  Lab 12/24/20 0522 12/26/20 1700  LIPASE 199* 85*   No results for input(s): AMMONIA in the last 168 hours. Coagulation Profile: No results for input(s): INR, PROTIME in the last 168 hours. Cardiac Enzymes: No results for input(s): CKTOTAL, CKMB, CKMBINDEX, TROPONINI in the last 168 hours. BNP (last 3 results) No results for input(s): PROBNP in the last 8760 hours. HbA1C: No results for input(s): HGBA1C in the last 72 hours. CBG: Recent Labs  Lab  12/28/20 1220 12/28/20 1703 12/28/20 2156 12/29/20 0801 12/29/20 1125  GLUCAP 322* 199* 198* 177* 204*   Lipid Profile: No results for input(s): CHOL, HDL, LDLCALC, TRIG, CHOLHDL, LDLDIRECT in the last 72 hours. Thyroid Function Tests: No results for input(s): TSH, T4TOTAL, FREET4, T3FREE, THYROIDAB in the last 72 hours. Anemia Panel: No results for input(s): VITAMINB12, FOLATE, FERRITIN, TIBC, IRON, RETICCTPCT in the last 72 hours. Sepsis Labs: Recent Labs  Lab 12/23/20 2251 12/24/20 0126  LATICACIDVEN 2.8* 2.2*    Recent Results (from the past 240 hour(s))  Resp Panel by RT-PCR (Flu A&B, Covid) Nasopharyngeal Swab     Status: None   Collection Time: 12/23/20 10:51 PM   Specimen: Nasopharyngeal Swab; Nasopharyngeal(NP) swabs in vial transport medium  Result Value Ref Range Status   SARS Coronavirus 2 by RT PCR NEGATIVE NEGATIVE Final    Comment: (NOTE) SARS-CoV-2 target nucleic acids are NOT DETECTED.  The SARS-CoV-2 RNA is generally detectable in upper respiratory specimens during the acute phase of infection. The lowest concentration of SARS-CoV-2 viral copies this assay can detect is 138 copies/mL. A negative result does not preclude SARS-Cov-2 infection and should not be used as the sole basis for treatment or other patient management decisions. A negative result may occur with  improper specimen collection/handling, submission of specimen other than nasopharyngeal swab, presence of viral mutation(s) within the areas targeted by this assay, and inadequate number of viral copies(<138 copies/mL). A negative result must be combined with clinical observations, patient history, and epidemiological information. The expected result is Negative.  Fact Sheet for Patients:  EntrepreneurPulse.com.au  Fact Sheet for Healthcare Providers:  IncredibleEmployment.be  This test is no t yet approved or cleared by the [place] FDA and  has  been authorized for detection and/or diagnosis of SARS-CoV-2 by FDA under an Emergency Use Authorization (EUA). This EUA will remain  in effect (meaning this test can be used) for the duration of the COVID-19 declaration under Section 564(b)(1) of the Act, 21 U.S.C.section 360bbb-3(b)(1), unless the authorization is terminated  or revoked sooner.       Influenza A by PCR NEGATIVE NEGATIVE Final   Influenza B by PCR NEGATIVE NEGATIVE Final    Comment: (NOTE) The Xpert Xpress SARS-CoV-2/FLU/RSV plus assay is intended as an aid in the diagnosis of influenza from Nasopharyngeal swab specimens and should not be used as a sole basis for treatment. Nasal washings and aspirates are unacceptable for Xpert Xpress SARS-CoV-2/FLU/RSV testing.  Fact Sheet for Patients: EntrepreneurPulse.com.au  Fact Sheet for Healthcare Providers: IncredibleEmployment.be  This test is not yet approved or cleared by the [place] FDA and has been authorized for detection and/or diagnosis of SARS-CoV-2 by FDA under an Emergency Use Authorization (EUA). This EUA will remain in effect (meaning this test can be used) for the duration of the COVID-19 declaration under Section 564(b)(1) of the Act, 21 U.S.C. section 360bbb-3(b)(1), unless the authorization is terminated or revoked.  Performed at [facility], [address] [place], [place] [address]   Aerobic/Anaerobic Culture w Gram Stain (surgical/deep wound)     Status: None (Preliminary result)   Collection Time: 12/24/20  2:21 PM   Specimen: SCALP; Abscess  Result Value Ref Range Status   Specimen Description   Final    SCALP Performed at [facility], [address] [place], [place] [address]    Special Requests   Final    NONE Performed at [facility], [address] [place],  [address]    Gram Stain   Final  MODERATE WBC PRESENT, PREDOMINANTLY PMN ABUNDANT GRAM  POSITIVE COCCI Performed at [facility] [address], [place], [place] [address]    Culture   Final    ABUNDANT STAPHYLOCOCCUS LUGDUNENSIS NO ANAEROBES ISOLATED; CULTURE IN PROGRESS FOR 5 DAYS    Report Status PENDING  Incomplete   Organism ID, Bacteria STAPHYLOCOCCUS LUGDUNENSIS  Final      Susceptibility   Staphylococcus lugdunensis - MIC*    CIPROFLOXACIN <=0.5 SENSITIVE Sensitive     ERYTHROMYCIN <=0.25 SENSITIVE Sensitive     GENTAMICIN <=0.5 SENSITIVE Sensitive     OXACILLIN 0.5 SENSITIVE Sensitive     TETRACYCLINE <=1 SENSITIVE Sensitive     VANCOMYCIN <=0.5 SENSITIVE Sensitive     TRIMETH/SULFA <=10 SENSITIVE Sensitive     CLINDAMYCIN <=0.25 SENSITIVE Sensitive     RIFAMPIN <=0.5 SENSITIVE Sensitive     Inducible Clindamycin NEGATIVE Sensitive     * ABUNDANT STAPHYLOCOCCUS LUGDUNENSIS  MRSA Next Gen by PCR, Nasal     Status: None   Collection Time: 12/24/20  6:58 PM   Specimen: Nasal Mucosa; Nasal Swab  Result Value Ref Range Status   MRSA by PCR Next Gen NOT DETECTED NOT DETECTED Final    Comment: (NOTE) The GeneXpert MRSA Assay (FDA approved for NASAL specimens only), is one component of a comprehensive MRSA colonization surveillance program. It is not intended to diagnose MRSA infection nor to guide or monitor treatment for MRSA infections. Test performance is not FDA approved in patients less than 67 years old. Performed at [facility], [address] [place], [place] [address]          Radiology Studies: ECHOCARDIOGRAM COMPLETE  Result Date: 12/28/2020    ECHOCARDIOGRAM REPORT   Patient Name:   WILMER SANTILLO Date of Exam: 12/28/2020 Medical Rec #:  [id number]     Height:       71.0 in Accession #:    [id number]    Weight:       250.0 lb Date of Birth:  1975-07-18     BSA:          2.318 m Patient Age:    73 years      BP:           144/87 mmHg Patient Gender: M             HR:           90 bpm. Exam Location:  [facility] Procedure: 2D Echo,  Cardiac Doppler and Color Doppler Indications:     CHF-acute diastolic T03.54  History:         Patient has no prior history of Echocardiogram examinations. No                  past medical history on file.  Sonographer:     Sherrie Sport Referring Phys:  [id number] Sharen Hones Diagnosing Phys: Kate Sable MD IMPRESSIONS  1. Left ventricular ejection fraction, by estimation, is 60 to 65%. The left ventricle has normal function. The left ventricle has no regional wall motion abnormalities. Left ventricular diastolic parameters were normal.  2. Right ventricular systolic function is normal. The right ventricular size is normal.  3. The mitral valve is normal in structure. No evidence of mitral valve regurgitation.  4. The aortic valve is grossly normal. Aortic valve regurgitation is not visualized. FINDINGS  Left Ventricle: Left ventricular ejection fraction, by estimation, is 60 to 65%. The left ventricle has normal function. The left ventricle has  no regional wall motion abnormalities. The left ventricular internal cavity size was normal in size. There is  no left ventricular hypertrophy. Left ventricular diastolic parameters were normal. Right Ventricle: The right ventricular size is normal. No increase in right ventricular wall thickness. Right ventricular systolic function is normal. Left Atrium: Left atrial size was normal in size. Right Atrium: Right atrial size was normal in size. Pericardium: There is no evidence of pericardial effusion. Mitral Valve: The mitral valve is normal in structure. No evidence of mitral valve regurgitation. Tricuspid Valve: The tricuspid valve is normal in structure. Tricuspid valve regurgitation is trivial. Aortic Valve: The aortic valve is grossly normal. Aortic valve regurgitation is not visualized. Aortic valve mean gradient measures 5.0 mmHg. Aortic valve peak gradient measures 8.2 mmHg. Aortic valve area, by VTI measures 2.84 cm. Pulmonic Valve: The pulmonic valve was not  well visualized. Pulmonic valve regurgitation is not visualized. Aorta: The aortic root is normal in size and structure. Venous: The inferior vena cava was not well visualized. IAS/Shunts: No atrial level shunt detected by color flow Doppler.  LEFT VENTRICLE PLAX 2D LVIDd:         4.10 cm  Diastology LVIDs:         2.90 cm  LV e' medial:    9.14 cm/s LV PW:         1.00 cm  LV E/e' medial:  10.5 LV IVS:        0.90 cm  LV e' lateral:   8.70 cm/s LVOT diam:     2.00 cm  LV E/e' lateral: 11.1 LV SV:         58 LV SV Index:   25 LVOT Area:     3.14 cm  RIGHT VENTRICLE RV Basal diam:  3.90 cm RV S prime:     21.10 cm/s TAPSE (M-mode): 3.7 cm LEFT ATRIUM             Index       RIGHT ATRIUM           Index LA diam:        2.20 cm 0.95 cm/m  RA Area:     16.90 cm LA Vol (A2C):   56.0 ml 24.16 ml/m RA Volume:   45.30 ml  19.54 ml/m LA Vol (A4C):   25.0 ml 10.78 ml/m LA Biplane Vol: 38.3 ml 16.52 ml/m  AORTIC VALVE                    PULMONIC VALVE AV Area (Vmax):    2.42 cm     PV Vmax:        0.92 m/s AV Area (Vmean):   2.35 cm     PV Peak grad:   3.4 mmHg AV Area (VTI):     2.84 cm     RVOT Peak grad: 7 mmHg AV Vmax:           143.00 cm/s AV Vmean:          103.000 cm/s AV VTI:            0.206 m AV Peak Grad:      8.2 mmHg AV Mean Grad:      5.0 mmHg LVOT Vmax:         110.00 cm/s LVOT Vmean:        77.100 cm/s LVOT VTI:          0.186 m LVOT/AV VTI ratio: 0.90  AORTA Ao Root diam:  3.03 cm MITRAL VALVE               TRICUSPID VALVE MV Area (PHT): 4.10 cm    TR Peak grad:   10.5 mmHg MV Decel Time: 185 msec    TR Vmax:        162.00 cm/s MV E velocity: 96.40 cm/s MV A velocity: 80.10 cm/s  SHUNTS MV E/A ratio:  1.20        Systemic VTI:  0.19 m                            Systemic Diam: 2.00 cm Kate Sable MD Electronically signed by Kate Sable MD Signature Date/Time: 12/28/2020/4:10:05 PM    Final    DG ESOPHAGUS W SINGLE CM (SOL OR THIN BA)  Result Date: 12/27/2020 CLINICAL DATA:  Epigastric  pain and dysphagia EXAM: ESOPHOGRAM/BARIUM SWALLOW TECHNIQUE: Single contrast examination was performed using  thin barium. FLUOROSCOPY TIME:  Fluoroscopy Time:  1 minutes 48 seconds Radiation Exposure Index (if provided by the fluoroscopic device): 30.7 mGy Number of Acquired Spot Images: 3 COMPARISON:  None. FINDINGS: Limited supine LPO esophagram due to patient condition. Mild esophageal dysmotility. No obstruction to the forward flow of contrast throughout the esophagus and into the stomach. There is evidence of a broad-based ulcer in the lower esophagus measuring up to 2.7 cm in length. Small sliding hiatal hernia. No gastroesophageal reflux occurred during the exam. The exam was stopped due to patient tolerance and condition. The patient had severe burning sensation after swallowing the barium. IMPRESSION: Evidence of a broad-based ulcer in the lower esophagus measuring up to 2.7 cm. Recommend correlation with endoscopy. Mild esophageal dysmotility. Small sliding-type hiatal hernia. No gastroesophageal reflux occurred during the exam. Limited esophagram due to patient condition. Patient had a severe burning sensation after swallowing barium. Electronically Signed   By: Maurine Simmering M.D.   On: 12/27/2020 15:22        Scheduled Meds:  [MAR Hold] amoxicillin-clavulanate  1 tablet Oral Q12H   [MAR Hold] Chlorhexidine Gluconate Cloth  6 each Topical Daily   [MAR Hold] doxycycline  100 mg Oral Q12H   [MAR Hold] enoxaparin (LOVENOX) injection  0.5 mg/kg Subcutaneous Q24H   [MAR Hold] insulin aspart  0-15 Units Subcutaneous TID WC   [MAR Hold] insulin aspart  0-5 Units Subcutaneous QHS   [MAR Hold] insulin aspart  10 Units Subcutaneous TID WC   [MAR Hold] insulin glargine-yfgn  25 Units Subcutaneous BID   [MAR Hold] insulin starter kit- pen needles  1 kit Other Once   [MAR Hold] lidocaine  1 patch Transdermal Q24H   [MAR Hold] multivitamin with minerals  1 tablet Oral Daily   [MAR Hold] pantoprazole   40 mg Oral BID   pneumococcal 23 valent vaccine  0.5 mL Intramuscular Tomorrow-1000   [MAR Hold] polyethylene glycol  17 g Oral Daily   [MAR Hold] potassium & sodium phosphates  2 packet Oral TID AC & HS   [MAR Hold] Ensure Max Protein  11 oz Oral BID   [MAR Hold] senna-docusate  2 tablet Oral BID   [START ON 12/30/2020] spironolactone  25 mg Oral Daily   [MAR Hold] sucralfate  1 g Oral TID WC & HS   Continuous Infusions:  sodium chloride 20 mL/hr at 12/29/20 1222     LOS: 5 days    Time spent: 28 minutes    Sharen Hones, MD [facility]  To contact the attending provider between 7A-7P or the covering provider during after hours 7P-7A, please log into the web site [email or web address] and access using universal [place] password for that web site. If you do not have the password, please call the hospital operator.  12/29/2020, 12:25 PM

## 2020-12-29 NOTE — Anesthesia Preprocedure Evaluation (Addendum)
Anesthesia Evaluation  Patient identified by MRN, date of birth, ID band Patient awake    Reviewed: Allergy & Precautions, NPO status , Patient's Chart, lab work & pertinent test results  History of Anesthesia Complications Negative for: history of anesthetic complications  Airway Mallampati: II  TM Distance: >3 FB Neck ROM: Full    Dental no notable dental hx. (+) Dental Advisory Given   Pulmonary Current Smoker and Patient abstained from smoking.,    Pulmonary exam normal breath sounds clear to auscultation       Cardiovascular Exercise Tolerance: Good negative cardio ROS Normal cardiovascular exam Rhythm:Regular Rate:Normal   Burning chest pain, neg cardiac w/u, felt to be related to GI ulcer  Echo 12/28/20: 1. Left ventricular ejection fraction, by estimation, is 60 to 65%. The left ventricle has normal function. The left ventricle has no regional wall motion abnormalities. Left ventricular diastolic parameters were normal.  2. Right ventricular systolic function is normal. The right ventricular size is normal.  3. The mitral valve is normal in structure. No evidence of mitral valve regurgitation.  4. The aortic valve is grossly normal. Aortic valve regurgitation is not visualized.    Neuro/Psych negative neurological ROS  negative psych ROS   GI/Hepatic hiatal hernia, PUD, (+) Hepatitis - (NASH) 45 y.o. male admitted with generalized malaise, epigastric and chest pain, nausea, vomiting for 1 week, with no hematemesis. Patient complained of odynophagia and upper GI series was done which showed broad-based ulcer in the lower esophagus measuring up to 2.7 cm, small hiatal hernia. Upper GI series also notes that patient had severe burning sensation after swallowing barium.   Endo/Other  diabetes, Insulin Dependent  BMI 35  Diagnosed with new onset diabetes on admission, admitted with DKA, and was initially on insulin  drip, but has been transitioned to subcutaneous insulin.  Renal/GU Renal disease (AKI, resolved)  negative genitourinary   Musculoskeletal negative musculoskeletal ROS (+)   Abdominal (+) + obese (BMI 35),   Peds negative pediatric ROS (+)  Hematology negative hematology ROS (+)   Anesthesia Other Findings   Reproductive/Obstetrics negative OB ROS                          From GI consult note: "45 y.o. male admitted 12/23/20 with generalized malaise, epigastric and chest pain, nausea, vomiting for 1 week, with no hematemesis.  Patient complained of odynophagia and upper GI series was done which showed broad-based ulcer in the lower esophagus measuring up to 2.7 cm, small hiatal hernia.  Upper GI series also notes that patient had severe burning sensation after swallowing barium.  Patient was diagnosed with new onset diabetes on admission, admitted with DKA, and was initially on insulin drip, but has been transitioned to subcu insulin.  Patient reports odynophagia and burning sensation at lower chest upon swallowing, that has only been going on since his presentation and since his symptoms of nausea vomiting started.  No prior history of similar symptoms.  No prior EGD.  No weight loss."   Anesthesia Physical Anesthesia Plan  ASA: 3  Anesthesia Plan: General   Post-op Pain Management:    Induction: Intravenous  PONV Risk Score and Plan: 1  Airway Management Planned: Natural Airway and Simple Face Mask  Additional Equipment: None  Intra-op Plan:   Post-operative Plan:   Informed Consent: I have reviewed the patients History and Physical, chart, labs and discussed the procedure including the risks, benefits and alternatives for the  proposed anesthesia with the patient or authorized representative who has indicated his/her understanding and acceptance.     Dental advisory given  Plan Discussed with: Anesthesiologist, CRNA and  Surgeon  Anesthesia Plan Comments:        Anesthesia Quick Evaluation

## 2020-12-29 NOTE — Op Note (Signed)
[facility] Patient Name: David Watts Procedure Date: 12/29/2020 12:13 PM MRN: [id number] Account #: [id number] Date of Birth: Jan 11, 1976 Admit Type: Inpatient Age: 45 Room: [facility] ENDO ROOM 4 Gender: Male Note Status: Finalized Procedure:             Upper GI endoscopy Indications:           Odynophagia Providers:             Midge Minium MD, MD Referring MD:          No Local Md, MD (Referring MD) Medicines:             Propofol per Anesthesia Complications:         No immediate complications. Procedure:             Pre-Anesthesia Assessment:                        - Prior to the procedure, a History and Physical was                         performed, and patient medications and allergies were                         reviewed. The patient's tolerance of previous                         anesthesia was also reviewed. The risks and benefits                         of the procedure and the sedation options and risks                         were discussed with the patient. All questions were                         answered, and informed consent was obtained. Prior                         Anticoagulants: The patient has taken no previous                         anticoagulant or antiplatelet agents. ASA Grade                         Assessment: II - A patient with mild systemic disease.                         After reviewing the risks and benefits, the patient                         was deemed in satisfactory condition to undergo the                         procedure.                        After obtaining informed consent, the endoscope was  passed under direct vision. Throughout the procedure,                         the patient's blood pressure, pulse, and oxygen                         saturations were monitored continuously. The Endoscope                         was introduced through the mouth, and advanced to the                          second part of duodenum. The upper GI endoscopy was                         accomplished without difficulty. The patient tolerated                         the procedure well. Findings:      LA Grade D (one or more mucosal breaks involving at least 75% of       esophageal circumference) esophagitis with no bleeding was found in the       entire esophagus.      Diffuse moderate inflammation characterized by erythema was found in the       entire examined stomach.      Diffuse severe inflammation characterized by erosions was found in the       entire duodenum. Impression:            - LA Grade D esophagitis with no bleeding.                        - Gastritis.                        - Duodenitis.                        - No specimens collected. Recommendation:        - Return patient to hospital ward for ongoing care.                        - Resume previous diet.                        - Continue present medications.                        - Use a proton pump inhibitor PO BID.                        - Check serum Gastrin levels Procedure Code(s):     --- Professional ---                        [phone], Esophagogastroduodenoscopy, flexible,                         transoral; diagnostic, including collection of                         specimen(s) by brushing  or washing, when performed                         (separate procedure) Diagnosis Code(s):     --- Professional ---                        R13.10, Dysphagia, unspecified                        K29.80, Duodenitis without bleeding                        K29.70, Gastritis, unspecified, without bleeding                        K20.90, Esophagitis, unspecified without bleeding CPT copyright 2019 American Medical Association. All rights reserved. The codes documented in this report are preliminary and upon coder review may  be revised to meet current compliance requirements. Midge Minium MD, MD 12/29/2020 12:47:06 PM This  report has been signed electronically. Number of Addenda: 0 Note Initiated On: 12/29/2020 12:13 PM Estimated Blood Loss:  Estimated blood loss: none.      [facility]

## 2020-12-29 NOTE — Anesthesia Postprocedure Evaluation (Signed)
Anesthesia Post Note  Patient: David Watts  Procedure(s) Performed: ESOPHAGOGASTRODUODENOSCOPY (EGD)  Patient location during evaluation: Endoscopy Anesthesia Type: General Level of consciousness: awake and alert Pain management: pain level controlled Vital Signs Assessment: post-procedure vital signs reviewed and stable Respiratory status: spontaneous breathing, nonlabored ventilation, respiratory function stable and patient connected to nasal cannula oxygen Cardiovascular status: stable and blood pressure returned to baseline Postop Assessment: no apparent nausea or vomiting Anesthetic complications: no   No notable events documented.   Last Vitals:  Vitals:   12/29/20 1312 12/29/20 1320  BP: 97/62 113/63  Pulse: 89 87  Resp: (!) 22 (!) 27  Temp:    SpO2: 97% 97%    Last Pain:  Vitals:   12/29/20 1320  TempSrc:   PainSc: 0-No pain                 Donato Schultz

## 2020-12-30 DIAGNOSIS — R1319 Other dysphagia: Secondary | ICD-10-CM | POA: Diagnosis not present

## 2020-12-30 LAB — BASIC METABOLIC PANEL
Anion gap: 7 (ref 5–15)
BUN: 10 mg/dL (ref 6–20)
CO2: 29 mmol/L (ref 22–32)
Calcium: 8.1 mg/dL — ABNORMAL LOW (ref 8.9–10.3)
Chloride: 100 mmol/L (ref 98–111)
Creatinine, Ser: 1.01 mg/dL (ref 0.61–1.24)
GFR, Estimated: >60 mL/min (ref >60)
Glucose, Bld: 125 mg/dL — ABNORMAL HIGH (ref 70–99)
Potassium: 3.8 mmol/L (ref 3.5–5.1)
Sodium: 136 mmol/L (ref 135–145)

## 2020-12-30 LAB — MAGNESIUM: Magnesium: 1.9 mg/dL (ref 1.7–2.4)

## 2020-12-30 LAB — GLUCOSE, CAPILLARY
Glucose-Capillary: 103 mg/dL — ABNORMAL HIGH (ref 70–99)
Glucose-Capillary: 95 mg/dL (ref 70–99)
Glucose-Capillary: 145 mg/dL — ABNORMAL HIGH (ref 70–99)

## 2020-12-30 LAB — POTASSIUM: Potassium: 4 mmol/L (ref 3.5–5.1)

## 2020-12-30 LAB — PHOSPHORUS: Phosphorus: 2.2 mg/dL — ABNORMAL LOW (ref 2.5–4.6)

## 2020-12-30 MED ORDER — POTASSIUM PHOSPHATES 15 MMOLE/5ML IV SOLN
20.0000 mmol | Freq: Once | INTRAVENOUS | Status: AC
  Administered 2020-12-30: 20 mmol via INTRAVENOUS
  Filled 2020-12-30: qty 6.67

## 2020-12-30 MED ORDER — PANTOPRAZOLE SODIUM 40 MG PO TBEC: 40.0000 mg | DELAYED_RELEASE_TABLET | Freq: Two times a day (BID) | ORAL | 0 refills | Status: AC

## 2020-12-30 MED ORDER — INSULIN LISPRO 200 UNIT/ML ~~LOC~~ SOPN: 5.0000 [IU] | PEN_INJECTOR | Freq: Three times a day (TID) | SUBCUTANEOUS | Status: DC

## 2020-12-30 MED ORDER — SPIRONOLACTONE 25 MG PO TABS: 25.0000 mg | ORAL_TABLET | Freq: Every day | ORAL | 0 refills | Status: AC

## 2020-12-30 MED ORDER — INSULIN GLARGINE 100 UNIT/ML SOLOSTAR PEN: 25.0000 [IU] | PEN_INJECTOR | Freq: Two times a day (BID) | SUBCUTANEOUS | 0 refills | Status: AC

## 2020-12-30 MED ORDER — INSULIN LISPRO 200 UNIT/ML ~~LOC~~ SOPN: 5.0000 [IU] | PEN_INJECTOR | Freq: Three times a day (TID) | SUBCUTANEOUS | Status: AC

## 2020-12-30 MED ORDER — K-PHOS-NEUTRAL 155-852-130 MG PO TABS: 1.0000 | ORAL_TABLET | Freq: Three times a day (TID) | ORAL | 0 refills | Status: AC

## 2020-12-30 MED ORDER — SUCRALFATE 1 GM/10ML PO SUSP: 1.0000 g | Freq: Three times a day (TID) | ORAL | 0 refills | Status: AC

## 2020-12-30 MED ORDER — INSULIN ASPART 100 UNIT/ML IJ SOLN
5.0000 [IU] | Freq: Three times a day (TID) | INTRAMUSCULAR | Status: DC
  Administered 2020-12-30: 5 [IU] via SUBCUTANEOUS
  Filled 2020-12-30: qty 1

## 2020-12-30 NOTE — TOC Transition Note (Signed)
Transition of Care [facility]) - CM/SW Discharge Note   Patient Details  Name: David Watts MRN: [id number] Date of Birth: [birth year]  [facility] CM/SW Contact:  Bing Quarry, RN Phone Number: 12/30/2020, 10:26 AM   Clinical Narrative:   Discharging home/Self Care due to no PCP at discharge. Patient understand follow up appointments and was able to do teach back on concepts from Diabetic Educator. Feels no need for PT and has transportation to appointments and is able to obtain medications. Will live with sister for a time post discharge. Is not comfortable living alone just yet. Says his family is very involved and sister is attempting to learn as much as she can to help him. Gabriel Cirri RN CM    Final next level of care: Home/Self Care Barriers to Discharge: Barriers Resolved   Patient Goals and CMS Choice Patient states their goals for this hospitalization and ongoing recovery are:: Patient just taking things one day at a time      Discharge Placement                       Discharge Plan and Services   Discharge Planning Services: CM Consult            DME Arranged: N/A DME Agency: NA       HH Arranged: NA HH Agency: NA (NO PCP yet.)        Social Determinants of Health (SDOH) Interventions     Readmission Risk Interventions No flowsheet data found.

## 2020-12-30 NOTE — Progress Notes (Signed)
David Wohl, MD FACG   [address] [place], [place] [address] Phone: [phone] Fax : [phone]   Subjective: Patient states he is doing much better today and is tolerating by mouth's.  He has much less pain than he did previously.  There is no report of any nausea vomiting black stools or bloody stools.  The patient had an EGD with esophagitis gastritis and duodenitis.   Objective: Vital signs in last 24 hours: Vitals:   12/29/20 1946 12/30/20 0504 12/30/20 0714 12/30/20 1148  BP: 124/78 117/75 116/79 111/80  Pulse: (!) 102 91 86 87  Resp: 18 18 18 16  Temp: 98.3 F (36.8 C) 98.7 F (37.1 C) 98 F (36.7 C) 98.4 F (36.9 C)  TempSrc:      SpO2: 99% 97% 94% 98%  Weight:      Height:       Weight change:   Intake/Output Summary (Last 24 hours) at 12/30/2020 1558 Last data filed at 12/30/2020 1400 Gross per 24 hour  Intake 1492.67 ml  Output 1875 ml  Net -382.33 ml     Exam: Heart:: Regular rate and rhythm, S1S2 present, or without murmur or extra heart sounds Lungs: normal and clear to auscultation and percussion Abdomen: soft, nontender, normal bowel sounds   Lab Results: @LABTEST2@ Micro Results: Recent Results (from the past 240 hour(s))  Resp Panel by RT-PCR (Flu A&B, Covid) Nasopharyngeal Swab     Status: None   Collection Time: 12/23/20 10:51 PM   Specimen: Nasopharyngeal Swab; Nasopharyngeal(NP) swabs in vial transport medium  Result Value Ref Range Status   SARS Coronavirus 2 by RT PCR NEGATIVE NEGATIVE Final    Comment: (NOTE) SARS-CoV-2 target nucleic acids are NOT DETECTED.  The SARS-CoV-2 RNA is generally detectable in upper respiratory specimens during the acute phase of infection. The lowest concentration of SARS-CoV-2 viral copies this assay can detect is 138 copies/mL. A negative result does not preclude SARS-Cov-2 infection and should not be used as the sole basis for treatment or other patient management decisions. A  negative result may occur with  improper specimen collection/handling, submission of specimen other than nasopharyngeal swab, presence of viral mutation(s) within the areas targeted by this assay, and inadequate number of viral copies(<138 copies/mL). A negative result must be combined with clinical observations, patient history, and epidemiological information. The expected result is Negative.  Fact Sheet for Patients:  https://www.fda.gov/media/152166/download  Fact Sheet for Healthcare Providers:  https://www.fda.gov/media/152162/download  This test is no t yet approved or cleared by the [place] FDA and  has been authorized for detection and/or diagnosis of SARS-CoV-2 by FDA under an Emergency Use Authorization (EUA). This EUA will remain  in effect (meaning this test can be used) for the duration of the COVID-19 declaration under Section 564(b)(1) of the Act, 21 U.S.C.section 360bbb-3(b)(1), unless the authorization is terminated  or revoked sooner.       Influenza A by PCR NEGATIVE NEGATIVE Final   Influenza B by PCR NEGATIVE NEGATIVE Final    Comment: (NOTE) The Xpert Xpress SARS-CoV-2/FLU/RSV plus assay is intended as an aid in the diagnosis of influenza from Nasopharyngeal swab specimens and should not be used as a sole basis for treatment. Nasal washings and aspirates are unacceptable for Xpert Xpress SARS-CoV-2/FLU/RSV testing.  Fact Sheet for Patients: https://www.fda.gov/media/152166/download  Fact Sheet for Healthcare Providers: https://www.fda.gov/media/152162/download  This test is not yet approved or cleared by the [place] FDA and has been authorized for detection and/or diagnosis of SARS-CoV-2   by FDA under an Emergency Use Authorization (EUA). This EUA will remain in effect (meaning this test can be used) for the duration of the COVID-19 declaration under Section 564(b)(1) of the Act, 21 U.S.C. section 360bbb-3(b)(1), unless the authorization  is terminated or revoked.  Performed at [facility], [address] [place], [place] [address]   Aerobic/Anaerobic Culture w Gram Stain (surgical/deep wound)     Status: None   Collection Time: 12/24/20  2:21 PM   Specimen: SCALP; Abscess  Result Value Ref Range Status   Specimen Description   Final    SCALP Performed at [facility], [address] [place], [place] [address]    Special Requests   Final    NONE Performed at [facility], [address] [place], [place] [address]    Gram Stain   Final    MODERATE WBC PRESENT, PREDOMINANTLY PMN ABUNDANT GRAM POSITIVE COCCI    Culture   Final    ABUNDANT STAPHYLOCOCCUS LUGDUNENSIS NO ANAEROBES ISOLATED Performed at [facility], [address] , [place] [address]    Report Status 12/29/2020 FINAL  Final   Organism ID, Bacteria STAPHYLOCOCCUS LUGDUNENSIS  Final      Susceptibility   Staphylococcus lugdunensis - MIC*    CIPROFLOXACIN <=0.5 SENSITIVE Sensitive     ERYTHROMYCIN <=0.25 SENSITIVE Sensitive     GENTAMICIN <=0.5 SENSITIVE Sensitive     OXACILLIN 0.5 SENSITIVE Sensitive     TETRACYCLINE <=1 SENSITIVE Sensitive     VANCOMYCIN <=0.5 SENSITIVE Sensitive     TRIMETH/SULFA <=10 SENSITIVE Sensitive     CLINDAMYCIN <=0.25 SENSITIVE Sensitive     RIFAMPIN <=0.5 SENSITIVE Sensitive     Inducible Clindamycin NEGATIVE Sensitive     * ABUNDANT STAPHYLOCOCCUS LUGDUNENSIS  MRSA Next Gen by PCR, Nasal     Status: None   Collection Time: 12/24/20  6:58 PM   Specimen: Nasal Mucosa; Nasal Swab  Result Value Ref Range Status   MRSA by PCR Next Gen NOT DETECTED NOT DETECTED Final    Comment: (NOTE) The GeneXpert MRSA Assay (FDA approved for NASAL specimens only), is one component of a comprehensive MRSA colonization surveillance program. It is not intended to diagnose MRSA infection nor to guide or monitor treatment for MRSA infections. Test performance is not FDA approved in  patients less than 2 years old. Performed at [facility], [address] [place], [place] [address]    Studies/Results: No results found. Medications: I have reviewed the patient's current medications. Scheduled Meds:  amoxicillin-clavulanate  1 tablet Oral Q12H   Chlorhexidine Gluconate Cloth  6 each Topical Daily   doxycycline  100 mg Oral Q12H   enoxaparin (LOVENOX) injection  0.5 mg/kg Subcutaneous Q24H   insulin aspart  0-15 Units Subcutaneous TID WC   insulin aspart  0-5 Units Subcutaneous QHS   insulin aspart  5 Units Subcutaneous TID WC   insulin glargine-yfgn  25 Units Subcutaneous BID   insulin starter kit- pen needles  1 kit Other Once   lidocaine  1 patch Transdermal Q24H   multivitamin with minerals  1 tablet Oral Daily   pantoprazole  40 mg Oral BID   pneumococcal 23 valent vaccine  0.5 mL Intramuscular Tomorrow-1000   polyethylene glycol  17 g Oral Daily   Ensure Max Protein  11 oz Oral BID   senna-docusate  2 tablet Oral BID   spironolactone  25 mg Oral Daily   sucralfate  1 g Oral   TID WC & HS   Continuous Infusions:  potassium PHOSPHATE IVPB (in mmol) 20 mmol (12/30/20 1034)   PRN Meds:.alum & mag hydroxide-simeth, bisacodyl, calcium carbonate, dextrose, ipratropium-albuterol, oxyCODONE-acetaminophen   Assessment: Principal Problem:   DKA (diabetic ketoacidosis), new onset Active Problems:   URI (upper respiratory infection)   AKI (acute kidney injury) (HCC)   Lactic acidosis   NASH (nonalcoholic steatohepatitis)   Reactive thrombocytosis   Hyponatremia   Dysphagia    Plan: The patient is status post EGD with esophagitis for dysphagia and Adina patient.  The patient states he is being sent home today.  He is awaiting discharge.  Nothing further to do from a GI point of view.  He should follow-up with Dr. Tahiliani after discharge.   LOS: 6 days   David Watts, FACG 12/30/2020, 3:58 PM Pager [phone] 7am-5pm  Check AMION for 5pm  -7am coverage and on weekends  

## 2020-12-30 NOTE — Discharge Summary (Signed)
Physician Discharge Summary  Patient ID: David Watts MRN: [id number] DOB/AGE: 43-Oct-1977 43 y.o.  Admit date: 12/23/2020 Discharge date: 12/30/2020  Admission Diagnoses:  Discharge Diagnoses:  Principal Problem:   DKA (diabetic ketoacidosis), new onset Active Problems:   URI (upper respiratory infection)   AKI (acute kidney injury) (HCC)   Lactic acidosis   NASH (nonalcoholic steatohepatitis)   Reactive thrombocytosis   Hyponatremia   Dysphagia   Discharged Condition: good  Hospital Course:  David Watts is a 45 y.o. male with No significant past medical history who presents to the ED with a 1 week history of generalized malaise, epigastric and chest pain as well as nonbloody nonbilious vomiting.  Patient had a 1 week history of abdominal cramping pain, nausea vomiting, has not been able to eat.  No bowel movement for 1 week. He also is a chronic smoker, he has some shortness of breath. Arriving the hospital, patient was a found to have new onset diabetes with diabetes ketoacidosis.  He is placed on insulin drip. Patient was able to change to subcu insulin on 8/10.  He has persistent hypokalemia, requiring large amount of IV infusion.  #1.  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 Acute kidney injury secondary to diabetes ketoacidosis. Lactic acidosis secondary to dehydration Received prolonged IV insulin infusion, his anion gap closed, beta hydroxy uric acid also normalized, but still has some metabolic acidosis.  He received bicarbonate, condition finally improved. Continue current Lantus and NovoLog and sliding scale insulin.  His glucose is actually running in normal range now, I will continue current dose of Lantus, but decrease NovoLog from 10 units to 5 units 3 times a day. Patient will be followed by endocrinologist.  I also asked social worker to set up PCP to be followed in 1 week.   2.  Hyponatremia Severe hypokalemia. Hypophosphatemia. Patient had a  severe hypokalemia.  He required multiple doses of IV potassium infusion.  He was also given Aldactone via addition to potassium.  His potassium finally normalized.  I will decrease Aldactone dose to 25 mg daily from 25 mg twice a day. He also required IV phosphate.  Today's phosphate 2.2, he will receiving 20 mmol of potassium phosphate.  We will continue oral phosphate for 7 days. Please check a BMP and  mag and phosphorus at the next office visit.   3.  Right upper abdominal pain. Dysphagia with severe heartburn. Nonalcoholic Steatohepatitis. Esophageal ulceration with esophagitis and gastritis. Right upper quadrant ultrasound showed steatohepatitis without gallstones cholecystitis.  Abdominal pain appears to be due to rib pain as well as constipation. Patient also had a severe heartburn, he could not eat.  He was treated with Protonix, sucralfate, Maalox and Tums. EGD showed esophagitis, gastritis with very large esophageal ulceration. After above treatment, patient is symptomatically improving.  He is tolerating a soft diet.  At this point, we will continue sucralfate and Protonix.  Patient will be follow-up with GI in the near future.  4.  Shortness of breath.  COPD Condition seem to be improving, partly this is due to COPD.  No evidence of congestive heart failure.  Echocardiogram showed normal ejection fraction without valvular changes.  No hypertrophic cardiomyopathy.  Currently, symptom has resolved.  Follow-up with PCP as outpatient.   #5. Scalp abscess. Status post I&D by general surgery.  Condition had improved, no need for additional antibiotics.  Patient be followed by general surgery as outpatient, also refer patient to wound care as outpatient.  Consults: GI  Significant Diagnostic Studies:  eft ventricular ejection fraction, by estimation, is 60 to 65%. The left ventricle has normal function. The left ventricle has no regional wall motion abnormalities. Left ventricular  diastolic parameters were normal. 1. 2. Right ventricular systolic function is normal. The right ventricular size is normal. 3. The mitral valve is normal in structure. No evidence of mitral valve regurgitation. 4. The aortic valve is grossly normal. Aortic valve regurgitation is not visualized.  ESOPHOGRAM/BARIUM SWALLOW   TECHNIQUE: Single contrast examination was performed using  thin barium.   FLUOROSCOPY TIME:  Fluoroscopy Time:  1 minutes 48 seconds   Radiation Exposure Index (if provided by the fluoroscopic device): 30.7 mGy   Number of Acquired Spot Images: 3   COMPARISON:  None.   FINDINGS: Limited supine LPO esophagram due to patient condition.   Mild esophageal dysmotility. No obstruction to the forward flow of contrast throughout the esophagus and into the stomach.   There is evidence of a broad-based ulcer in the lower esophagus measuring up to 2.7 cm in length.   Small sliding hiatal hernia. No gastroesophageal reflux occurred during the exam.   The exam was stopped due to patient tolerance and condition. The patient had severe burning sensation after swallowing the barium.   IMPRESSION: Evidence of a broad-based ulcer in the lower esophagus measuring up to 2.7 cm. Recommend correlation with endoscopy.   Mild esophageal dysmotility. Small sliding-type hiatal hernia. No gastroesophageal reflux occurred during the exam.   Limited esophagram due to patient condition. Patient had a severe burning sensation after swallowing barium.     Electronically Signed   By: Caprice Renshaw M.D.   On: 12/27/2020 15:22  ULTRASOUND ABDOMEN LIMITED RIGHT UPPER QUADRANT   COMPARISON:  None.   FINDINGS: Gallbladder:   No gallstones or wall thickening visualized. No sonographic Murphy sign noted by sonographer.   Common bile duct:   Diameter: 3 mm, normal.   Liver:   Mildly echogenic liver (image 40). No discrete liver lesion. No intrahepatic biliary ductal  dilatation. Portal vein is patent on color Doppler imaging with normal direction of blood flow towards the liver.   Other: Negative visible right kidney.   IMPRESSION: Evidence of hepatic steatosis, otherwise negative right upper quadrant ultrasound.     Electronically Signed   By: Odessa Fleming M.D.   On: 12/24/2020 11:15    Treatments: IVF, IV insulin, potassium, mag, phos, protonix, egd  Discharge Exam: Blood pressure 116/79, pulse 86, temperature 98 F (36.7 C), resp. rate 18, height 5\' 11"  (1.803 m), weight 113.4 kg, SpO2 94 %. General appearance: alert and cooperative Resp: clear to auscultation bilaterally Cardio: regular rate and rhythm, S1, S2 normal, no murmur, click, rub or gallop GI: soft, non-tender; bowel sounds normal; no masses,  no organomegaly Extremities: extremities normal, atraumatic, no cyanosis or edema Scalp wound. No draining.  Disposition: Discharge disposition: 01-Home or Self Care       Discharge Instructions     Ambulatory referral to Nutrition and Diabetic Education   Complete by: As directed    New DM dx; A1C 14.2%. Discharging new to insulin   Ambulatory referral to Wound Clinic   Complete by: As directed    Diet - low sodium heart healthy   Complete by: As directed    Discharge wound care:   Complete by: As directed    Follow with surgery and wound care   Increase activity slowly   Complete by: As directed  Allergies as of 12/30/2020   No Known Allergies      Medication List     STOP taking these medications    cephALEXin 500 MG capsule Commonly known as: KEFLEX   ibuprofen 800 MG tablet Commonly known as: ADVIL   ondansetron 4 MG disintegrating tablet Commonly known as: ZOFRAN-ODT       TAKE these medications    insulin glargine 100 UNIT/ML Solostar Pen Commonly known as: LANTUS Inject 25 Units into the skin 2 (two) times daily.   insulin lispro 200 UNIT/ML KwikPen Commonly known as: HUMALOG Inject 5  Units into the skin 3 (three) times daily before meals.   K-Phos-Neutral 155-852-130 MG Tabs Take 1 tablet by mouth in the morning, at noon, and at bedtime for 7 days.   pantoprazole 40 MG tablet Commonly known as: PROTONIX Take 1 tablet (40 mg total) by mouth 2 (two) times daily.   spironolactone 25 MG tablet Commonly known as: ALDACTONE Take 1 tablet (25 mg total) by mouth daily for 7 days. Start taking on: December 31, 2020   sucralfate 1 GM/10ML suspension Commonly known as: CARAFATE Take 10 mLs (1 g total) by mouth 4 (four) times daily -  with meals and at bedtime.               Discharge Care Instructions  (From admission, onward)           Start     Ordered   12/30/20 0000  Discharge wound care:       Comments: Follow with surgery and wound care   12/30/20 0929            Follow-up Information     Donovan Kail, PA-C. Schedule an appointment as soon as possible for a visit in 2 week(s).   Specialty: Physician Assistant Why: s/p I&D of scalp abscess Contact information: [address] [place] [address] [phone]         Raj Janus, MD. Schedule an appointment as soon as possible for a visit in 2 week(s).   Specialty: Endocrinology Contact information: [phone] [facility] [address] [facility] [place] [address] [phone]         Midge Minium, MD Follow up in 2 week(s).   Specialty: Gastroenterology Contact information: [address] [place]  [place] [address] [phone]               45 minutes  Signed: Marrion Coy 12/30/2020, 9:29 AM

## 2020-12-30 NOTE — TOC Progression Note (Signed)
Transition of Care [facility]) - Progression Note    Patient Details  Name: David Watts MRN: [id number] Date of Birth: Oct 08, 1975  Transition of Care [facility]) CM/SW Contact  Bing Quarry, RN Phone Number: 12/30/2020, 10:21 AM  Clinical Narrative:  Patient to be discharged to Home/Self Care today. Has been see by Diabetic Coordinator several times. He did teach back on insulin, things to watch for for low blood sugar, who to call, his follow up appointments and continuing to obtain a PCP. May need follow up [facility] with RN for diabetic needs but seems to have a good beginning understanding and what things to tell his family to watch for and who to call if he needs help. Family member was trying to secure a PCP but patient was unsure if that had been done, so advised to check with endocrinologist follow up or a phone call to them at the [place] clinic. Communication with provider as well. Gabriel Cirri RN CM    Expected Discharge Plan: Home/Self Care Barriers to Discharge: Continued Medical Work up  Expected Discharge Plan and Services Expected Discharge Plan: Home/Self Care   Discharge Planning Services: CM Consult   Living arrangements for the past 2 months: Single Family Home Expected Discharge Date: 12/30/20               DME Arranged: N/A DME Agency: NA       HH Arranged: NA HH Agency: NA         Social Determinants of Health (SDOH) Interventions    Readmission Risk Interventions No flowsheet data found.

## 2020-12-31 ENCOUNTER — Encounter: Payer: Self-pay | Admitting: Gastroenterology

## 2021-01-01 LAB — GASTRIN: Gastrin: 29 pg/mL (ref 0–115)

## 2021-01-11 ENCOUNTER — Encounter: Payer: BC Managed Care – PPO | Attending: Physician Assistant | Admitting: Physician Assistant

## 2021-01-11 ENCOUNTER — Other Ambulatory Visit: Payer: Self-pay

## 2021-01-11 DIAGNOSIS — L98492 Non-pressure chronic ulcer of skin of other sites with fat layer exposed: Secondary | ICD-10-CM | POA: Insufficient documentation

## 2021-01-11 DIAGNOSIS — Z87891 Personal history of nicotine dependence: Secondary | ICD-10-CM | POA: Insufficient documentation

## 2021-01-11 DIAGNOSIS — E11622 Type 2 diabetes mellitus with other skin ulcer: Secondary | ICD-10-CM | POA: Diagnosis present

## 2021-01-11 DIAGNOSIS — L02811 Cutaneous abscess of head [any part, except face]: Secondary | ICD-10-CM | POA: Diagnosis not present

## 2021-01-11 NOTE — Progress Notes (Signed)
David Watts, David Watts ([id number]) Visit Report for 01/11/2021 Chief Complaint Document Details Patient Name: David Watts, David Watts Date of Service: 01/11/2021 12:45 PM Medical Record Number: [id number] Patient Account Number: [id number] Date of Birth/Sex: 18-Oct-1975 (45 y.o. M) Treating RN: Huel CoventryWoody, Kim Primary Care Provider: SYSTEM, PCP Other Clinician: Referring Provider: Marrion CoyZhang, Dekui Treating Provider/Extender: Rowan BlaseStone, Wenceslaus Gist Weeks in Treatment: 0 Information Obtained from: Patient Chief Complaint Scalp abscess/ulcer Electronic Signature(s) Signed: 01/11/2021 1:33:18 PM By: Lenda KelpStone III, Aryana Wonnacott PA-C Entered By: Lenda KelpStone III, Paiton Fosco on 01/11/2021 13:33:18 David Watts, David Watts ([id number]) -------------------------------------------------------------------------------- HPI Details Patient Name: David Watts, David Watts Date of Service: 01/11/2021 12:45 PM Medical Record Number: [id number] Patient Account Number: [id number] Date of Birth/Sex: 18-Oct-1975 ([age] y.o. M) Treating RN: Huel CoventryWoody, Kim Primary Care Provider: SYSTEM, PCP Other Clinician: Referring Provider: Marrion CoyZhang, Dekui Treating Provider/Extender: Rowan BlaseStone, Abu Heavin Weeks in Treatment: 0 History of Present Illness HPI Description: 01/11/2021 upon evaluation today patient appears to be doing well with regard to his scalp ulcer this is actually the first time I am seeing him but overall this does not appear to be too badly. Apparently this was an abscess that was noted when he was in the hospital subsequent to diabetic ketoacidosis. This is when he first found out that he was diabetic. Fortunately overall I feel like the patient is actually doing decently well at this time. Fortunately there does not appear to be any signs of active infection systemically which is good news. I do think there is little bit of hypergranulation noted and that is can need to be addressed. With regard to the diagnosis this was actually in August 7 he was diagnosed with diabetic ketoacidosis when admitted to the  hospital he is now on oral and IV antibiotics for his diabetes. Electronic Signature(s) Signed: 01/11/2021 1:46:24 PM By: Lenda KelpStone III, Erleen Egner PA-C Entered By: Lenda KelpStone III, [place] Kuang on 01/11/2021 13:46:24 David Watts, David Watts ([id number]) -------------------------------------------------------------------------------- Physical Exam Details Patient Name: David Watts, David Watts Date of Service: 01/11/2021 12:45 PM Medical Record Number: [id number] Patient Account Number: [id number] Date of Birth/Sex: 18-Oct-1975 (45 y.o. M) Treating RN: Huel CoventryWoody, Kim Primary Care Provider: SYSTEM, PCP Other Clinician: Referring Provider: Marrion CoyZhang, Dekui Treating Provider/Extender: Rowan BlaseStone, Paislee Szatkowski Weeks in Treatment: 0 Constitutional sitting or standing blood pressure is within target range for patient.. pulse regular and within target range for patient.Marland Kitchen. respirations regular, non- labored and within target range for patient.Marland Kitchen. temperature within target range for patient.. Well-nourished and well-hydrated in no acute distress. Eyes conjunctiva clear no eyelid edema noted. pupils equal round and reactive to light and accommodation. Ears, Nose, Mouth, and Throat no gross abnormality of ear auricles or external auditory canals. normal hearing noted during conversation. mucus membranes moist. Respiratory normal breathing without difficulty. Musculoskeletal normal gait and posture. no significant deformity or arthritic changes, no loss or range of motion, no clubbing. Psychiatric this patient is able to make decisions and demonstrates good insight into disease process. Alert and Oriented x 3. pleasant and cooperative. Notes Upon inspection patient's wound bed actually showed signs of good granulation epithelization at this point. Fortunately there does not appear to be any signs of infection which is great news and overall I am extremely pleased with where things stand. There is some hypergranular activity going in regard to the granulation and I  think this does need to be addressed I think Hydrofera Blue will likely do quite well for him. The patient is in agreement with that plan and we will get a go ahead and show with this. We will have to perform this dressing change. Electronic Signature(s)  Signed: 01/11/2021 1:47:12 PM By: Lenda Kelp PA-C Entered By: Lenda Kelp on 01/11/2021 13:47:12 David Sartorius ([id number]) -------------------------------------------------------------------------------- Physician Orders Details Patient Name: David Sartorius Date of Service: 01/11/2021 12:45 PM Medical Record Number: [id number] Patient Account Number: [id number] Date of Birth/Sex: [birth year] (45 y.o. M) Treating RN: Huel Coventry Primary Care Provider: SYSTEM, PCP Other Clinician: Referring Provider: Marrion Coy Treating Provider/Extender: Rowan Blase in Treatment: 0 Verbal / Phone Orders: No Diagnosis Coding ICD-10 Coding Code Description E11.622 Type 2 diabetes mellitus with other skin ulcer L02.811 Cutaneous abscess of head [any part, except face] L98.492 Non-pressure chronic ulcer of skin of other sites with fat layer exposed Follow-up Appointments o Return Appointment in 1 week. Bathing/ Shower/ Hygiene Wound #1 Left Head - Parietal o May shower; gently cleanse wound with antibacterial soap, rinse and pat dry prior to dressing wounds Wound Treatment Wound #1 - Head - Parietal Wound Laterality: Left Cleanser: Soap and Water Discharge Instructions: Gently cleanse wound with antibacterial soap, rinse and pat dry prior to dressing wounds Primary Dressing: Hydrofera Blue Ready Transfer Foam, 2.5x2.5 (in/in) Discharge Instructions: Apply Hydrofera Blue Ready to wound bed as directed Secondary Dressing: Coverlet Latex-Free Fabric Adhesive Dressings Discharge Instructions: 1.5 x 2 Electronic Signature(s) Signed: 01/11/2021 3:53:32 PM By: Lenda Kelp PA-C Signed: 01/11/2021 4:00:05 PM By: Elliot Gurney, BSN, RN, CWS, Kim RN,  BSN Entered By: Elliot Gurney, BSN, RN, CWS, Kim on 01/11/2021 13:39:03 David Sartorius ([id number]) -------------------------------------------------------------------------------- Problem List Details Patient Name: David Sartorius Date of Service: 01/11/2021 12:45 PM Medical Record Number: [id number] Patient Account Number: [id number] Date of Birth/Sex: 1975/12/26 (45 y.o. M) Treating RN: Huel Coventry Primary Care Provider: SYSTEM, PCP Other Clinician: Referring Provider: Marrion Coy Treating Provider/Extender: Rowan Blase in Treatment: 0 Active Problems ICD-10 Encounter Code Description Active Date MDM Diagnosis E11.622 Type 2 diabetes mellitus with other skin ulcer 01/11/2021 No Yes L02.811 Cutaneous abscess of head [any part, except face] 01/11/2021 No Yes L98.492 Non-pressure chronic ulcer of skin of other sites with fat layer exposed 01/11/2021 No Yes Inactive Problems Resolved Problems Electronic Signature(s) Signed: 01/11/2021 1:33:05 PM By: Lenda Kelp PA-C Entered By: Lenda Kelp on 01/11/2021 13:33:04 David Sartorius ([id number]) -------------------------------------------------------------------------------- Progress Note Details Patient Name: David Sartorius Date of Service: 01/11/2021 12:45 PM Medical Record Number: [id number] Patient Account Number: [id number] Date of Birth/Sex: [birth year] (45 y.o. M) Treating RN: Huel Coventry Primary Care Provider: SYSTEM, PCP Other Clinician: Referring Provider: Marrion Coy Treating Provider/Extender: Rowan Blase in Treatment: 0 Subjective Chief Complaint Information obtained from Patient Scalp abscess/ulcer History of Present Illness (HPI) 01/11/2021 upon evaluation today patient appears to be doing well with regard to his scalp ulcer this is actually the first time I am seeing him but overall this does not appear to be too badly. Apparently this was an abscess that was noted when he was in the hospital subsequent to  diabetic ketoacidosis. This is when he first found out that he was diabetic. Fortunately overall I feel like the patient is actually doing decently well at this time. Fortunately there does not appear to be any signs of active infection systemically which is good news. I do think there is little bit of hypergranulation noted and that is can need to be addressed. With regard to the diagnosis this was actually in August 7 he was diagnosed with diabetic ketoacidosis when admitted to the hospital he is now on oral and IV antibiotics for his diabetes. Patient History Information obtained from Patient, Chart. Allergies No  Known Drug Allergies Social History Former smoker - ended on 10/29/2020, Marital Status - Single, Alcohol Use - Rarely, Drug Use - No History, Caffeine Use - Never. Medical History Endocrine Patient has history of Type II Diabetes Patient is treated with Insulin. Blood sugar is tested. Hospitalization/Surgery History - August 7 Diabetes diagnosis. Review of Systems (ROS) Eyes Denies complaints or symptoms of Dry Eyes, Vision Changes, Glasses / Contacts. Ear/Nose/Mouth/Throat Denies complaints or symptoms of Difficult clearing ears, Sinusitis. Hematologic/Lymphatic Denies complaints or symptoms of Bleeding / Clotting Disorders, Human Immunodeficiency Virus. Respiratory Denies complaints or symptoms of Chronic or frequent coughs, Shortness of Breath. Cardiovascular Denies complaints or symptoms of Chest pain, LE edema. Gastrointestinal Denies complaints or symptoms of Frequent diarrhea, Nausea, Vomiting. Genitourinary Denies complaints or symptoms of Kidney failure/ Dialysis, Incontinence/dribbling. Immunological Denies complaints or symptoms of Hives, Itching. Integumentary (Skin) Complains or has symptoms of Wounds. Denies complaints or symptoms of Bleeding or bruising tendency, Breakdown, Swelling. Musculoskeletal Denies complaints or symptoms of Muscle Pain, Muscle  Weakness. Neurologic Denies complaints or symptoms of Numbness/parasthesias, Focal/Weakness. Psychiatric Denies complaints or symptoms of Anxiety, Claustrophobia. IZAK, ANDING ([id number]) Objective Constitutional sitting or standing blood pressure is within target range for patient.. pulse regular and within target range for patient.Marland Kitchen respirations regular, non- labored and within target range for patient.Marland Kitchen temperature within target range for patient.. Well-nourished and well-hydrated in no acute distress. Vitals Time Taken: 1:05 PM, Height: 70 in, Weight: 248.5 lbs, BMI: 35.7, Temperature: 98.6 F, Pulse: 87 bpm, Respiratory Rate: 16 breaths/min, Blood Pressure: 120/78 mmHg. Eyes conjunctiva clear no eyelid edema noted. pupils equal round and reactive to light and accommodation. Ears, Nose, Mouth, and Throat no gross abnormality of ear auricles or external auditory canals. normal hearing noted during conversation. mucus membranes moist. Respiratory normal breathing without difficulty. Musculoskeletal normal gait and posture. no significant deformity or arthritic changes, no loss or range of motion, no clubbing. Psychiatric this patient is able to make decisions and demonstrates good insight into disease process. Alert and Oriented x 3. pleasant and cooperative. General Notes: Upon inspection patient's wound bed actually showed signs of good granulation epithelization at this point. Fortunately there does not appear to be any signs of infection which is great news and overall I am extremely pleased with where things stand. There is some hypergranular activity going in regard to the granulation and I think this does need to be addressed I think Hydrofera Blue will likely do quite well for him. The patient is in agreement with that plan and we will get a go ahead and show with this. We will have to perform this dressing change. Integumentary (Hair, Skin) Wound #1 status is Open. Original  cause of wound was Gradually Appeared. The date acquired was: 12/23/2020. The wound is located on the Left Head - Parietal. The wound measures 0.4cm length x 1.5cm width x 0.6cm depth; 0.471cm^2 area and 0.283cm^3 volume. There is Fat Layer (Subcutaneous Tissue) exposed. There is undermining starting at 11:00 and ending at 1:00 with a maximum distance of 0.4cm. There is a medium amount of serous drainage noted. The wound margin is flat and intact. There is large (67-100%) red granulation within the wound bed. There is no necrotic tissue within the wound bed. Assessment Active Problems ICD-10 Type 2 diabetes mellitus with other skin ulcer Cutaneous abscess of head [any part, except face] Non-pressure chronic ulcer of skin of other sites with fat layer exposed Plan Follow-up Appointments: Return Appointment in 1 week. Bathing/ Shower/ Hygiene: Wound #  1 Left Head - Parietal: May shower; gently cleanse wound with antibacterial soap, rinse and pat dry prior to dressing wounds WOUND #1: - Head - Parietal Wound Laterality: Left Cleanser: Soap and Water Discharge Instructions: Gently cleanse wound with antibacterial soap, rinse and pat dry prior to dressing wounds Primary Dressing: Hydrofera Blue Ready Transfer Foam, 2.5x2.5 (in/in) Discharge Instructions: Apply Hydrofera Blue Ready to wound bed as directed Secondary Dressing: Coverlet Latex-Free Fabric Adhesive Dressings Discharge Instructions: 1.5 x 2 Wagar, Gailen ([id number]) 1. Would recommend currently that we going continue with wound care measures as before and the patient is in agreement with plan this includes the use of the [facility] dressing which I think is good to be the best thing to help with the hypergranulation. 2. I am also can recommend at this time that we have the patient going to continue with the Band-Aid to cover to hold in place I think that is the best way to go as well as far as this is concerned. We will see  patient back for reevaluation in 1 week here in the clinic. If anything worsens or changes patient will contact our office for additional recommendations. Electronic Signature(s) Signed: 01/11/2021 1:47:35 PM By: Lenda Kelp PA-C Entered By: Lenda Kelp on 01/11/2021 13:47:35 David Sartorius ([id number]) -------------------------------------------------------------------------------- ROS/PFSH Details Patient Name: David Sartorius Date of Service: 01/11/2021 12:45 PM Medical Record Number: [id number] Patient Account Number: [id number] Date of Birth/Sex: 06-20-1975 (45 y.o. M) Treating RN: Huel Coventry Primary Care Provider: SYSTEM, PCP Other Clinician: Referring Provider: Marrion Coy Treating Provider/Extender: Rowan Blase in Treatment: 0 Information Obtained From Patient Chart Eyes Complaints and Symptoms: Negative for: Dry Eyes; Vision Changes; Glasses / Contacts Ear/Nose/Mouth/Throat Complaints and Symptoms: Negative for: Difficult clearing ears; Sinusitis Hematologic/Lymphatic Complaints and Symptoms: Negative for: Bleeding / Clotting Disorders; Human Immunodeficiency Virus Respiratory Complaints and Symptoms: Negative for: Chronic or frequent coughs; Shortness of Breath Cardiovascular Complaints and Symptoms: Negative for: Chest pain; LE edema Gastrointestinal Complaints and Symptoms: Negative for: Frequent diarrhea; Nausea; Vomiting Genitourinary Complaints and Symptoms: Negative for: Kidney failure/ Dialysis; Incontinence/dribbling Immunological Complaints and Symptoms: Negative for: Hives; Itching Integumentary (Skin) Complaints and Symptoms: Positive for: Wounds Negative for: Bleeding or bruising tendency; Breakdown; Swelling Musculoskeletal Complaints and Symptoms: Negative for: Muscle Pain; Muscle Weakness Neurologic Complaints and Symptoms: Negative for: Numbness/parasthesias; Focal/Weakness [place], Ortencia Kick ([id number]) Psychiatric Complaints and  Symptoms: Negative for: Anxiety; Claustrophobia Endocrine Medical History: Positive for: Type II Diabetes Time with diabetes: 2 weeks Treated with: Insulin Blood sugar tested every day: Yes Tested : as needed Oncologic Immunizations Pneumococcal Vaccine: Received Pneumococcal Vaccination: No Implantable Devices None Hospitalization / Surgery History Type of Hospitalization/Surgery August 7 Diabetes diagnosis Family and Social History Former smoker - ended on 10/29/2020; Marital Status - Single; Alcohol Use: Rarely; Drug Use: No History; Caffeine Use: Never Electronic Signature(s) Signed: 01/11/2021 3:53:32 PM By: Lenda Kelp PA-C Signed: 01/11/2021 4:00:05 PM By: Elliot Gurney, BSN, RN, CWS, Kim RN, BSN Entered By: Elliot Gurney, BSN, RN, CWS, Kim on 01/11/2021 13:14:12 David Sartorius ([id number]) -------------------------------------------------------------------------------- SuperBill Details Patient Name: David Sartorius Date of Service: 01/11/2021 Medical Record Number: [id number] Patient Account Number: [id number] Date of Birth/Sex: May 28, 1975 (45 y.o. M) Treating RN: Huel Coventry Primary Care Provider: SYSTEM, PCP Other Clinician: Referring Provider: Marrion Coy Treating Provider/Extender: Rowan Blase in Treatment: 0 Diagnosis Coding ICD-10 Codes Code Description E11.622 Type 2 diabetes mellitus with other skin ulcer L02.811 Cutaneous abscess of head [any part, except face] L98.492 Non-pressure chronic ulcer of skin  of other sites with fat layer exposed Facility Procedures CPT4 Code: [id number] Description: 99214 - WOUND CARE VISIT-LEV 4 EST PT Modifier: Quantity: 1 Physician Procedures CPT4 Code: [id number] Description: 99204 - WC PHYS LEVEL 4 - NEW PT Modifier: Quantity: 1 CPT4 Code: Description: ICD-10 Diagnosis Description E11.622 Type 2 diabetes mellitus with other skin ulcer L02.811 Cutaneous abscess of head [any part, except face] L98.492 Non-pressure chronic ulcer of  skin of other sites with fat layer expos Modifier: ed Quantity: Electronic Signature(s) Signed: 01/11/2021 1:48:01 PM By: Lenda Kelp PA-C Entered By: Lenda Kelp on 01/11/2021 13:48:00

## 2021-01-11 NOTE — Progress Notes (Signed)
David Watts, David Watts ([id number]) Visit Report for 01/11/2021 Abuse/Suicide Risk Screen Details Patient Name: David Watts, David Watts Date of Service: 01/11/2021 12:45 PM Medical Record Number: [id number] Patient Account Number: [id number] Date of Birth/Sex: 1975/06/09 (45 y.o. M) Treating RN: Huel Coventry Primary Care Konya Fauble: SYSTEM, PCP Other Clinician: Referring Baxter Gonzalez: Marrion Coy Treating Brison Fiumara/Extender: Rowan Blase in Treatment: 0 Abuse/Suicide Risk Screen Items Answer ABUSE RISK SCREEN: Has anyone close to you tried to hurt or harm you recentlyo No Do you feel uncomfortable with anyone in your familyo No Has anyone forced you do things that you didnot want to doo No Electronic Signature(s) Signed: 01/11/2021 4:00:05 PM By: Elliot Gurney, BSN, RN, CWS, Kim RN, BSN Entered By: Elliot Gurney, BSN, RN, CWS, Kim on 01/11/2021 13:14:22 David Watts ([id number]) -------------------------------------------------------------------------------- Activities of Daily Living Details Patient Name: David Watts Date of Service: 01/11/2021 12:45 PM Medical Record Number: [id number] Patient Account Number: [id number] Date of Birth/Sex: 1976/02/16 (45 y.o. M) Treating RN: Huel Coventry Primary Care Ursula Dermody: SYSTEM, PCP Other Clinician: Referring Marguita Venning: Marrion Coy Treating Paarth Cropper/Extender: Rowan Blase in Treatment: 0 Activities of Daily Living Items Answer Activities of Daily Living (Please select one for each item) Drive Automobile Not Able Take Medications Completely Able Use Telephone Completely Able Care for Appearance Completely Able Use Toilet Completely Able Bath / Shower Completely Able Dress Self Completely Able Feed Self Completely Able Walk Completely Able Get In / Out Bed Completely Able Housework Completely Able Prepare Meals Completely Able Handle Money Completely Able Shop for Self Completely Able Electronic Signature(s) Signed: 01/11/2021 4:00:05 PM By: Elliot Gurney, BSN, RN, CWS, Kim  RN, BSN Entered By: Elliot Gurney, BSN, RN, CWS, Kim on 01/11/2021 13:14:49 David Watts ([id number]) -------------------------------------------------------------------------------- Education Screening Details Patient Name: David Watts Date of Service: 01/11/2021 12:45 PM Medical Record Number: [id number] Patient Account Number: [id number] Date of Birth/Sex: 05-23-1975 (45 y.o. M) Treating RN: Huel Coventry Primary Care Tanish Sinkler: SYSTEM, PCP Other Clinician: Referring Jarrick Fjeld: Marrion Coy Treating Adonijah Baena/Extender: Rowan Blase in Treatment: 0 Primary Learner Assessed: Patient Learning Preferences/Education Level/Primary Language Learning Preference: Explanation, Demonstration, Printed Material Highest Education Level: High School Preferred Language: English Cognitive Barrier Language Barrier: No Translator Needed: No Memory Deficit: No Emotional Barrier: No Cultural/Religious Beliefs Affecting Medical Care: No Physical Barrier Impaired Vision: Yes Glasses Impaired Hearing: No Decreased Hand dexterity: No Knowledge/Comprehension Knowledge Level: High Comprehension Level: High Ability to understand written instructions: High Ability to understand verbal instructions: High Motivation Anxiety Level: Calm Cooperation: Cooperative Education Importance: Acknowledges Need Interest in Health Problems: Asks Questions Perception: Coherent Willingness to Engage in Self-Management High Activities: Readiness to Engage in Self-Management High Activities: Electronic Signature(s) Signed: 01/11/2021 4:00:05 PM By: Elliot Gurney, BSN, RN, CWS, Kim RN, BSN Entered By: Elliot Gurney, BSN, RN, CWS, Kim on 01/11/2021 13:15:21 David Watts ([id number]) -------------------------------------------------------------------------------- Fall Risk Assessment Details Patient Name: David Watts Date of Service: 01/11/2021 12:45 PM Medical Record Number: [id number] Patient Account Number: [id number] Date of  Birth/Sex: 15-Sep-1975 (45 y.o. M) Treating RN: Huel Coventry Primary Care Shantel Helwig: SYSTEM, PCP Other Clinician: Referring Thursa Emme: Marrion Coy Treating Arless Vineyard/Extender: Rowan Blase in Treatment: 0 Fall Risk Assessment Items Have you had 2 or more falls in the last 12 monthso 0 No Have you had any fall that resulted in injury in the last 12 monthso 0 No FALLS RISK SCREEN History of falling - immediate or within 3 months 0 No Secondary diagnosis (Do you have 2 or more medical diagnoseso) 0 No Ambulatory aid None/bed rest/wheelchair/nurse 0 Yes Crutches/cane/walker 0 No Furniture 0 No Intravenous  therapy Access/Saline/Heparin Lock 0 No Gait/Transferring Normal/ bed rest/ wheelchair 0 Yes Weak (short steps with or without shuffle, stooped but able to lift head while walking, may 0 No seek support from furniture) Impaired (short steps with shuffle, may have difficulty arising from chair, head down, impaired 0 No balance) Mental Status Oriented to own ability 0 Yes Electronic Signature(s) Signed: 01/11/2021 4:00:05 PM By: Elliot Gurney, BSN, RN, CWS, Kim RN, BSN Entered By: Elliot Gurney, BSN, RN, CWS, Kim on 01/11/2021 13:15:34 David Watts ([id number]) -------------------------------------------------------------------------------- Foot Assessment Details Patient Name: David Watts Date of Service: 01/11/2021 12:45 PM Medical Record Number: [id number] Patient Account Number: [id number] Date of Birth/Sex: [birth year] (45 y.o. M) Treating RN: Huel Coventry Primary Care Yani Lal: SYSTEM, PCP Other Clinician: Referring Emonie Espericueta: Marrion Coy Treating Teja Costen/Extender: Rowan Blase in Treatment: 0 Foot Assessment Items Site Locations + = Sensation present, - = Sensation absent, C = Callus, U = Ulcer R = Redness, W = Warmth, M = Maceration, PU = Pre-ulcerative lesion F = Fissure, S = Swelling, D = Dryness Assessment Right: Left: Other Deformity: No No Prior Foot Ulcer: No No Prior  Amputation: No No Charcot Joint: No No Ambulatory Status: Ambulatory Without Help Gait: Steady Electronic Signature(s) Signed: 01/11/2021 4:00:05 PM By: Elliot Gurney, BSN, RN, CWS, Kim RN, BSN Entered By: Elliot Gurney, BSN, RN, CWS, Kim on 01/11/2021 13:16:14 David Watts ([id number]) -------------------------------------------------------------------------------- Nutrition Risk Screening Details Patient Name: David Watts Date of Service: 01/11/2021 12:45 PM Medical Record Number: [id number] Patient Account Number: [id number] Date of Birth/Sex: [birth year] (45 y.o. M) Treating RN: Huel Coventry Primary Care Khizar Fiorella: SYSTEM, PCP Other Clinician: Referring Namon Villarin: Marrion Coy Treating Kalicia Dufresne/Extender: Rowan Blase in Treatment: 0 Height (in): 70 Weight (lbs): 248.5 Body Mass Index (BMI): 35.7 Nutrition Risk Screening Items Score Screening NUTRITION RISK SCREEN: I have an illness or condition that made me change the kind and/or amount of food I eat 2 Yes I eat fewer than two meals per day 0 No I eat few fruits and vegetables, or milk products 0 No I have three or more drinks of beer, liquor or wine almost every day 0 No I have tooth or mouth problems that make it hard for me to eat 0 No I don't always have enough money to buy the food I need 0 No I eat alone most of the time 0 No I take three or more different prescribed or over-the-counter drugs a day 0 No Without wanting to, I have lost or gained 10 pounds in the last six months 0 No I am not always physically able to shop, cook and/or feed myself 0 No Nutrition Protocols Good Risk Protocol 0 No interventions needed Moderate Risk Protocol High Risk Proctocol Risk Level: Good Risk Score: 2 Electronic Signature(s) Signed: 01/11/2021 4:00:05 PM By: Elliot Gurney, BSN, RN, CWS, Kim RN, BSN Entered By: Elliot Gurney, BSN, RN, CWS, Kim on 01/11/2021 13:16:06

## 2021-01-11 NOTE — Progress Notes (Addendum)
CADELL, GABRIELSON ([id number]) Visit Report for 01/11/2021 Allergy List Details Patient Name: David Watts, David Watts Date of Service: 01/11/2021 12:45 PM Medical Record Number: [id number] Patient Account Number: [id number] Date of Birth/Sex: 11-25-1975 (45 y.o. M) Treating RN: Huel Coventry Primary Care Urho Rio: SYSTEM, PCP Other Clinician: Referring Erling Arrazola: Marrion Coy Treating Aarush Stukey/Extender: Rowan Blase in Treatment: 0 Allergies Active Allergies No Known Drug Allergies Allergy Notes Electronic Signature(s) Signed: 01/11/2021 4:00:05 PM By: Elliot Gurney, BSN, RN, CWS, Kim RN, BSN Entered By: Elliot Gurney, BSN, RN, CWS, Kim on 01/11/2021 13:10:54 Thornell Sartorius ([id number]) -------------------------------------------------------------------------------- Arrival Information Details Patient Name: Thornell Sartorius Date of Service: 01/11/2021 12:45 PM Medical Record Number: [id number] Patient Account Number: [id number] Date of Birth/Sex: 07-29-1975 (45 y.o. M) Treating RN: Huel Coventry Primary Care Helene Bernstein: SYSTEM, PCP Other Clinician: Referring Micharl Helmes: Marrion Coy Treating Kahne Helfand/Extender: Rowan Blase in Treatment: 0 Visit Information Patient Arrived: Ambulatory Arrival Time: 13:06 Accompanied By: self Transfer Assistance: None Patient Identification Verified: Yes Secondary Verification Process Completed: Yes Patient Has Alerts: Yes Patient Alerts: Type II Diabetic Electronic Signature(s) Signed: 01/11/2021 4:00:05 PM By: Elliot Gurney, BSN, RN, CWS, Kim RN, BSN Entered By: Elliot Gurney, BSN, RN, CWS, Kim on 01/11/2021 13:07:31 Thornell Sartorius ([id number]) -------------------------------------------------------------------------------- Clinic Level of Care Assessment Details Patient Name: Thornell Sartorius Date of Service: 01/11/2021 12:45 PM Medical Record Number: [id number] Patient Account Number: [id number] Date of Birth/Sex: 1976/03/09 (45 y.o. M) Treating RN: Huel Coventry Primary Care Verneice Caspers: SYSTEM,  PCP Other Clinician: Referring Tamaiya Bump: Marrion Coy Treating Blondine Hottel/Extender: Rowan Blase in Treatment: 0 Clinic Level of Care Assessment Items TOOL 2 Quantity Score []  - Use when only an EandM is performed on the INITIAL visit 0 ASSESSMENTS - Nursing Assessment / Reassessment X - General Physical Exam (combine w/ comprehensive assessment (listed just below) when performed on new 1 20 pt. evals) X- 1 25 Comprehensive Assessment (HX, ROS, Risk Assessments, Wounds Hx, etc.) ASSESSMENTS - Wound and Skin Assessment / Reassessment X - Simple Wound Assessment / Reassessment - one wound 1 5 []  - 0 Complex Wound Assessment / Reassessment - multiple wounds []  - 0 Dermatologic / Skin Assessment (not related to wound area) ASSESSMENTS - Ostomy and/or Continence Assessment and Care []  - Incontinence Assessment and Management 0 []  - 0 Ostomy Care Assessment and Management (repouching, etc.) PROCESS - Coordination of Care X - Simple Patient / Family Education for ongoing care 1 15 []  - 0 Complex (extensive) Patient / Family Education for ongoing care X- 1 10 Staff obtains , Records, Test Results / Process Orders []  - 0 Staff telephones HHA, Nursing Homes / Clarify orders / etc []  - 0 Routine Transfer to another Facility (non-emergent condition) []  - 0 Routine Hospital Admission (non-emergent condition) X- 1 15 New Admissions / / Ordering NPWT, Apligraf, etc. []  - 0 Emergency Hospital Admission (emergent condition) X- 1 10 Simple Discharge Coordination []  - 0 Complex (extensive) Discharge Coordination PROCESS - Special Needs []  - Pediatric / Minor Patient Management 0 []  - 0 Isolation Patient Management []  - 0 Hearing / Language / Visual special needs []  - 0 Assessment of Community assistance (transportation, D/C planning, etc.) []  - 0 Additional assistance / Altered mentation []  - 0 Support Surface(s) Assessment (bed, cushion, seat,  etc.) INTERVENTIONS - Wound Cleansing / Measurement X - Wound Imaging (photographs - any number of wounds) 1 5 []  - 0 Wound Tracing (instead of photographs) X- 1 5 Simple Wound Measurement - one wound []  - 0 Complex Wound Measurement - multiple wounds [place],  Kasir ([id number]) X- 1 5 Simple Wound Cleansing - one wound []  - 0 Complex Wound Cleansing - multiple wounds INTERVENTIONS - Wound Dressings []  - Small Wound Dressing one or multiple wounds 0 X- 1 15 Medium Wound Dressing one or multiple wounds []  - 0 Large Wound Dressing one or multiple wounds []  - 0 Application of Medications - injection INTERVENTIONS - Miscellaneous []  - External ear exam 0 []  - 0 Specimen Collection (cultures, biopsies, blood, body fluids, etc.) []  - 0 Specimen(s) / Culture(s) sent or taken to Lab for analysis []  - 0 Patient Transfer (multiple staff / Lift / Similar devices) []  - 0 Simple Staple / Suture removal (25 or less) []  - 0 Complex Staple / Suture removal (26 or more) []  - 0 Hypo / Hyperglycemic Management (close monitor of Blood Glucose) []  - 0 Ankle / Brachial Index (ABI) - do not check if billed separately Has the patient been seen at the hospital within the last three years: Yes Total Score: 130 Level Of Care: New/Established - Level 4 Electronic Signature(s) Signed: 01/11/2021 4:00:05 PM By: , BSN, RN, CWS, Kim RN, BSN Entered By: , BSN, RN, CWS, Kim on 01/11/2021 13:39:32 ( ) -------------------------------------------------------------------------------- Encounter Discharge Information Details Patient Name: Date of Service: 01/11/2021 12:45 PM Medical Record Number: Patient Account Number: Date of Birth/Sex: 20-Apr-1976 (45 y.o. M) Treating RN: 01/13/2021 Primary Care Duane Trias: SYSTEM, PCP Other Clinician: Referring Sherese Heyward: Elliot Gurney Treating Fady Stamps/Extender: Elliot Gurney in Treatment:  0 Encounter Discharge Information Items Discharge Condition: Stable Ambulatory Status: Ambulatory Discharge Destination: Home Transportation: Private Auto Accompanied By: sister Schedule Follow-up Appointment: Yes Clinical Summary of Care: Electronic Signature(s) Signed: 01/11/2021 4:00:05 PM By: Thornell Sartorius, BSN, RN, CWS, Kim RN, BSN Entered By: [id number], BSN, RN, CWS, Kim on 01/11/2021 13:47:43 01/13/2021 ([id number]) -------------------------------------------------------------------------------- Lower Extremity Assessment Details Patient Name: [id number] Date of Service: 01/11/2021 12:45 PM Medical Record Number: [id number] Patient Account Number: Huel Coventry Date of Birth/Sex: July 16, 1975 (45 y.o. M) Treating RN: 01/13/2021 Primary Care Teala Daffron: SYSTEM, PCP Other Clinician: Referring Cheron Coryell: Elliot Gurney Treating Maylea Soria/Extender: Elliot Gurney in Treatment: 0 Electronic Signature(s) Signed: 01/11/2021 4:00:05 PM By: Thornell Sartorius, BSN, RN, CWS, Kim RN, BSN Entered By: [id number], BSN, RN, CWS, Kim on 01/11/2021 13:10:43 01/13/2021 ([id number]) -------------------------------------------------------------------------------- Multi Wound Chart Details Patient Name: [id number] Date of Service: 01/11/2021 12:45 PM Medical Record Number: [id number] Patient Account Number: Huel Coventry Date of Birth/Sex: 11/18/1975 (45 y.o. M) Treating RN: 01/13/2021 Primary Care Ludwika Rodd: SYSTEM, PCP Other Clinician: Referring Salim Forero: Elliot Gurney Treating Riyanshi Wahab/Extender: Elliot Gurney in Treatment: 0 Vital Signs Height(in): 70 Pulse(bpm): 87 Weight(lbs): 248.5 Blood Pressure(mmHg): 120/78 Body Mass Index(BMI): 36 Temperature(F): 98.6 Respiratory Rate(breaths/min): 16 Photos: [N/A:N/A] Wound Location: Left Head - Parietal N/A N/A Wounding Event: Gradually Appeared N/A N/A Primary Etiology: Abscess N/A N/A Comorbid History: Type II Diabetes N/A N/A Date Acquired: 12/23/2020 N/A  N/A Weeks of Treatment: 0 N/A N/A Wound Status: Open N/A N/A Measurements L x W x D (cm) 0.4x1.5x0.6 N/A N/A Area (cm) : 0.471 N/A N/A Volume (cm) : 0.283 N/A N/A % Reduction in Area: 0.00% N/A N/A % Reduction in Volume: 0.00% N/A N/A Starting Position 1 (o'clock): 11 Ending Position 1 (o'clock): 1 Maximum Distance 1 (cm): 0.4 Undermining: Yes N/A N/A Classification: Full Thickness Without Exposed N/A N/A Support Structures Exudate Amount: Medium N/A N/A Exudate Type: Serous N/A N/A Exudate Color: amber N/A N/A Wound Margin: Flat and Intact N/A N/A  Granulation Amount: Large (67-100%) N/A N/A Granulation Quality: Red N/A N/A Necrotic Amount: None Present (0%) N/A N/A Exposed Structures: Fat Layer (Subcutaneous Tissue): N/A N/A Yes Fascia: No Tendon: No Muscle: No Joint: No Bone: No IRVINE, GLORIOSO ([id number]) Treatment Notes Electronic Signature(s) Signed: 01/11/2021 4:00:05 PM By: Elliot Gurney, BSN, RN, CWS, Kim RN, BSN Entered By: Elliot Gurney, BSN, RN, CWS, Kim on 01/11/2021 13:37:37 Thornell Sartorius ([id number]) -------------------------------------------------------------------------------- Multi-Disciplinary Care Plan Details Patient Name: Thornell Sartorius Date of Service: 01/11/2021 12:45 PM Medical Record Number: [id number] Patient Account Number: [id number] Date of Birth/Sex: Sep 07, 1975 (45 y.o. M) Treating RN: Huel Coventry Primary Care Renee Beale: SYSTEM, PCP Other Clinician: Referring Shalunda Lindh: Marrion Coy Treating Ignazio Kincaid/Extender: Rowan Blase in Treatment: 0 Active Inactive Electronic Signature(s) Signed: 02/20/2021 1:18:32 PM By: Elliot Gurney, BSN, RN, CWS, Kim RN, BSN Previous Signature: 01/11/2021 4:00:05 PM Version By: Elliot Gurney, BSN, RN, CWS, Kim RN, BSN Entered By: Elliot Gurney, BSN, RN, CWS, Kim on 02/20/2021 13:18:32 Thornell Sartorius ([id number]) -------------------------------------------------------------------------------- Pain Assessment Details Patient Name: Thornell Sartorius Date  of Service: 01/11/2021 12:45 PM Medical Record Number: [id number] Patient Account Number: [id number] Date of Birth/Sex: Mar 20, 1976 (45 y.o. M) Treating RN: Huel Coventry Primary Care Arius Harnois: SYSTEM, PCP Other Clinician: Referring Hallee Mckenny: Marrion Coy Treating Vena Bassinger/Extender: Rowan Blase in Treatment: 0 Active Problems Location of Pain Severity and Description of Pain Patient Has Paino No Site Locations With Dressing Change: No Pain Management and Medication Current Pain Management: Notes Patient denies pain at this time. Electronic Signature(s) Signed: 01/11/2021 4:00:05 PM By: Elliot Gurney, BSN, RN, CWS, Kim RN, BSN Entered By: Elliot Gurney, BSN, RN, CWS, Kim on 01/11/2021 13:07:56 Thornell Sartorius ([id number]) -------------------------------------------------------------------------------- Patient/Caregiver Education Details Patient Name: Thornell Sartorius Date of Service: 01/11/2021 12:45 PM Medical Record Number: [id number] Patient Account Number: [id number] Date of Birth/Gender: 1975-12-02 (45 y.o. M) Treating RN: Huel Coventry Primary Care Physician: SYSTEM, PCP Other Clinician: Referring Physician: Marrion Coy Treating Physician/Extender: Rowan Blase in Treatment: 0 Education Assessment Education Provided To: Patient Education Topics Provided Elevated Blood Sugar/ Impact on Healing: Handouts: Elevated Blood Sugars: How Do They Affect Wound Healing Methods: Demonstration, Explain/Verbal Responses: State content correctly [facility]: Handouts: [facility] Methods: Demonstration, Explain/Verbal Responses: State content correctly Wound/Skin Impairment: Handouts: Caring for Your Ulcer, Other: wound care as prescribed Methods: Demonstration, Explain/Verbal Responses: State content correctly Electronic Signature(s) Signed: 01/11/2021 4:00:05 PM By: Elliot Gurney, BSN, RN, CWS, Kim RN, BSN Entered By: Elliot Gurney, BSN, RN, CWS, Kim on 01/11/2021  13:40:11 Thornell Sartorius ([id number]) -------------------------------------------------------------------------------- Wound Assessment Details Patient Name: Thornell Sartorius Date of Service: 01/11/2021 12:45 PM Medical Record Number: [id number] Patient Account Number: [id number] Date of Birth/Sex: 1975/10/06 (45 y.o. M) Treating RN: Huel Coventry Primary Care Nohea Kras: SYSTEM, PCP Other Clinician: Referring Angles Trevizo: Marrion Coy Treating Denym Christenberry/Extender: Rowan Blase in Treatment: 0 Wound Status Wound Number: 1 Primary Etiology: Abscess Wound Location: Left Head - Parietal Wound Status: Open Wounding Event: Gradually Appeared Comorbid History: Type II Diabetes Date Acquired: 12/23/2020 Weeks Of Treatment: 0 Clustered Wound: No Photos Wound Measurements Length: (cm) 0.4 Width: (cm) 1.5 Depth: (cm) 0.6 Area: (cm) 0.471 Volume: (cm) 0.283 % Reduction in Area: 0% % Reduction in Volume: 0% Undermining: Yes Starting Position (o'clock): 11 Ending Position (o'clock): 1 Maximum Distance: (cm) 0.4 Wound Description Classification: Full Thickness Without Exposed Support Structu Wound Margin: Flat and Intact Exudate Amount: Medium Exudate Type: Serous Exudate Color: amber res Foul Odor After Cleansing: No Slough/Fibrino No Wound Bed Granulation Amount: Large (67-100%) Exposed Structure Granulation  Quality: Red Fascia Exposed: No Necrotic Amount: None Present (0%) Fat Layer (Subcutaneous Tissue) Exposed: Yes Tendon Exposed: No Muscle Exposed: No Joint Exposed: No Bone Exposed: No Electronic Signature(s) Signed: 01/11/2021 4:00:05 PM By: Elliot GurneyWoody, BSN, RN, CWS, Kim RN, BSN Entered By: Elliot GurneyWoody, BSN, RN, CWS, Kim on 01/11/2021 13:24:11 Thornell SartoriusLAMBERT, Mary ([id number]) -------------------------------------------------------------------------------- Vitals Details Patient Name: Thornell SartoriusLAMBERT, Lecil Date of Service: 01/11/2021 12:45 PM Medical Record Number: [id number] Patient Account Number:  [id number] Date of Birth/Sex: Aug 11, 1975 (45 y.o. M) Treating RN: Huel CoventryWoody, Kim Primary Care Mayukha Symmonds: SYSTEM, PCP Other Clinician: Referring Pinki Rottman: Marrion CoyZhang, Dekui Treating Gwyneth Fernandez/Extender: Rowan BlaseStone, Hoyt Weeks in Treatment: 0 Vital Signs Time Taken: 13:05 Temperature (F): 98.6 Height (in): 70 Pulse (bpm): 87 Weight (lbs): 248.5 Respiratory Rate (breaths/min): 16 Body Mass Index (BMI): 35.7 Blood Pressure (mmHg): 120/78 Reference Range: 80 - 120 mg / dl Electronic Signature(s) Signed: 01/11/2021 4:00:05 PM By: Elliot GurneyWoody, BSN, RN, CWS, Kim RN, BSN Entered By: Elliot GurneyWoody, BSN, RN, CWS, Kim on 01/11/2021 13:08:6513:08:28

## 2021-01-16 ENCOUNTER — Telehealth: Payer: Self-pay

## 2021-01-16 NOTE — Telephone Encounter (Signed)
LVM for pt to return my call.

## 2021-01-16 NOTE — Telephone Encounter (Signed)
Pt. Requesting call back there is some confusion about which Dr. To schedule for. [phone]

## 2021-01-22 ENCOUNTER — Ambulatory Visit: Payer: BC Managed Care – PPO | Admitting: Physician Assistant

## 2021-02-28 ENCOUNTER — Telehealth: Payer: Self-pay

## 2021-02-28 NOTE — Telephone Encounter (Signed)
-----   Message from Pasty Spillers, MD sent at 02/25/2021 11:00 AM EDT ----- Patient was supposed to follow-up in clinic after hospital discharge.  Ginger it appears you did try reaching out to him.  Please make clinic follow-up with Dr. Servando Snare or myself

## 2021-02-28 NOTE — Telephone Encounter (Signed)
Letter mailed asking pt to call office to schedule f/u OV

## 2022-11-26 IMAGING — US US ABDOMEN LIMITED
1 series · 14 of 25 positions shown · non-contrast
Comparison: None.

CLINICAL DATA: 45-year-old male with right upper quadrant pain,
nausea and vomiting for 1 week. Abnormal LFTs.

EXAM:
ULTRASOUND ABDOMEN LIMITED RIGHT UPPER QUADRANT

[Series 1: us abdomen limited ruq (liver/gb) · 14 of 39 slices shown]
[im 1/39]
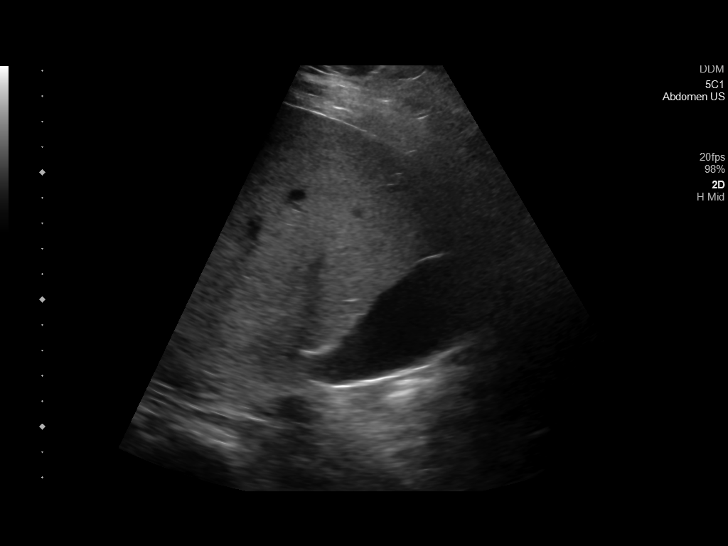
[im 4/39]
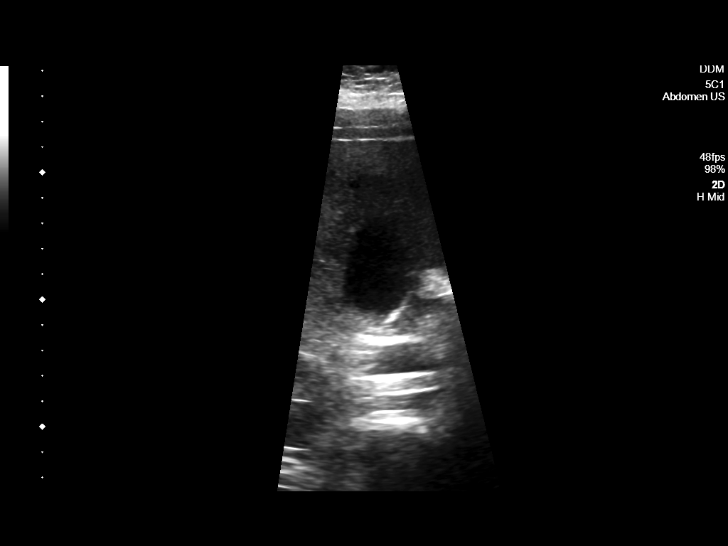
[im 7/39]
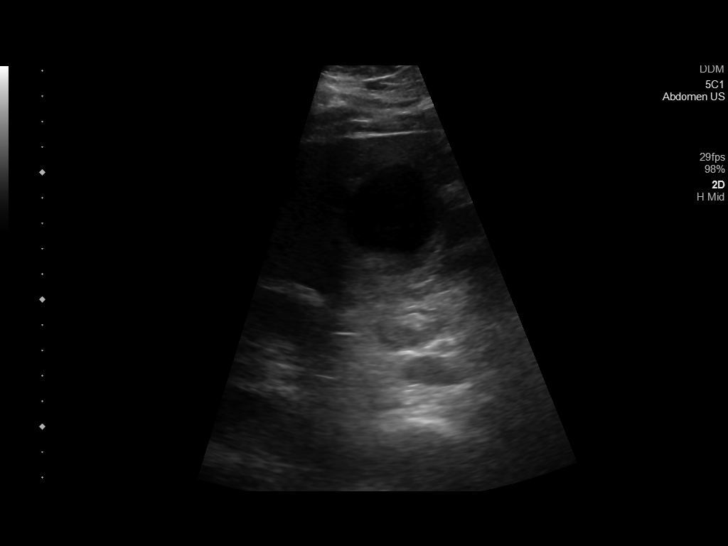
[im 10/39]
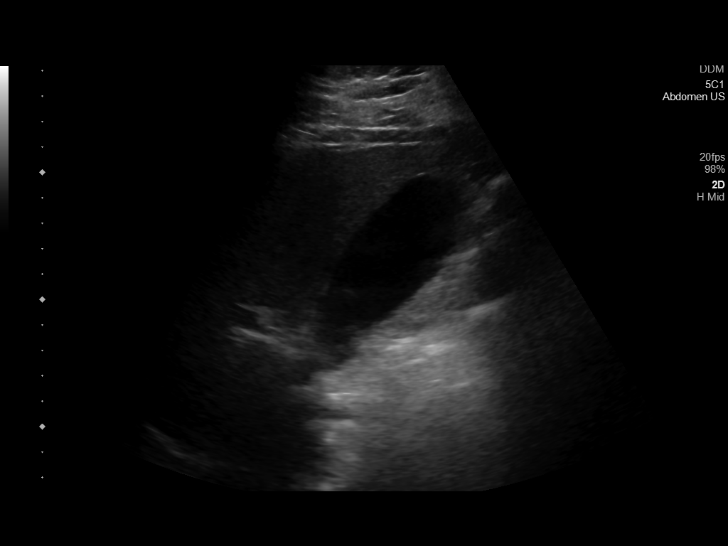
[im 13/39]
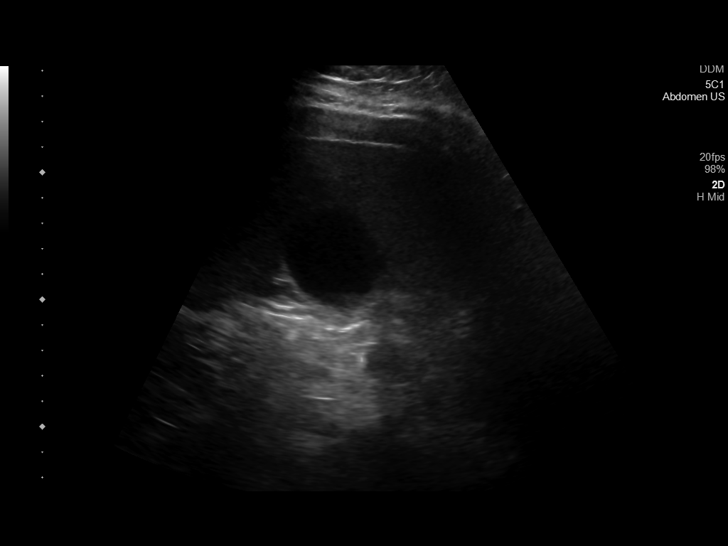
[im 15/39]
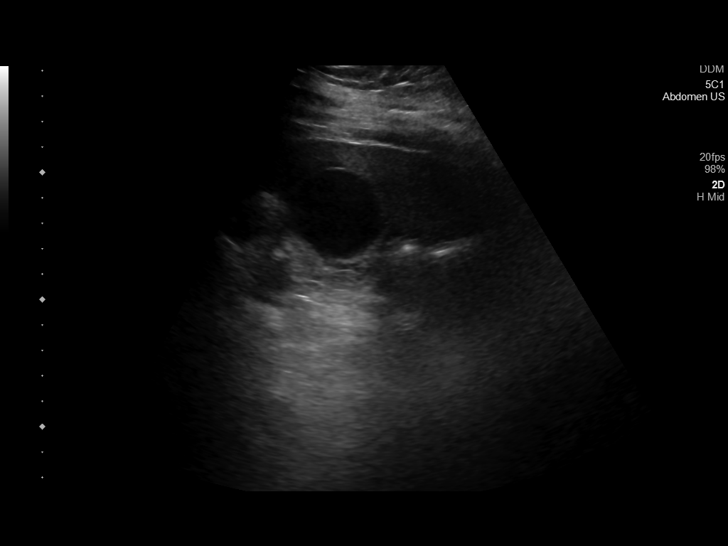
[im 18/39]
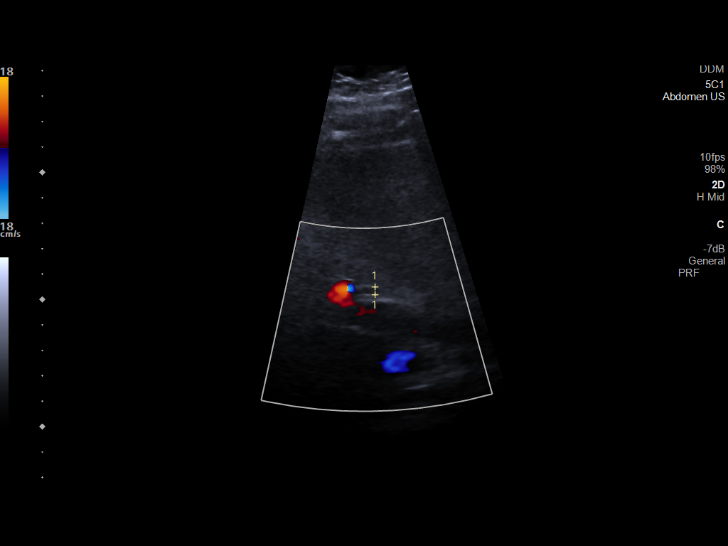
[im 21/39]
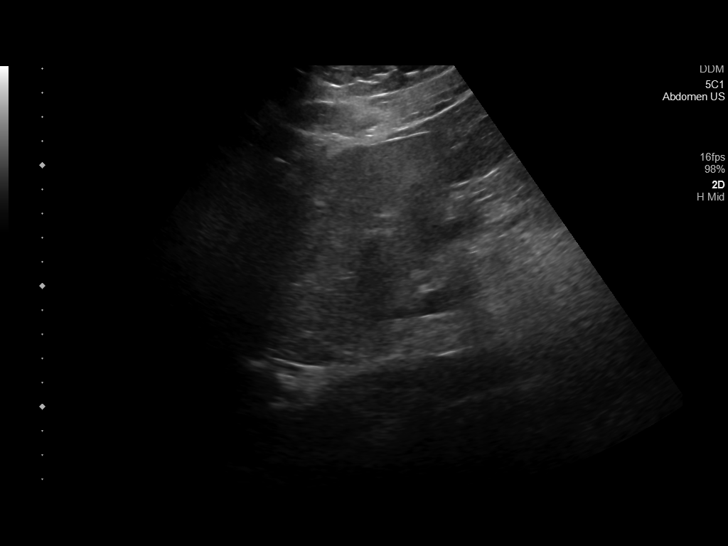
[im 24/39]
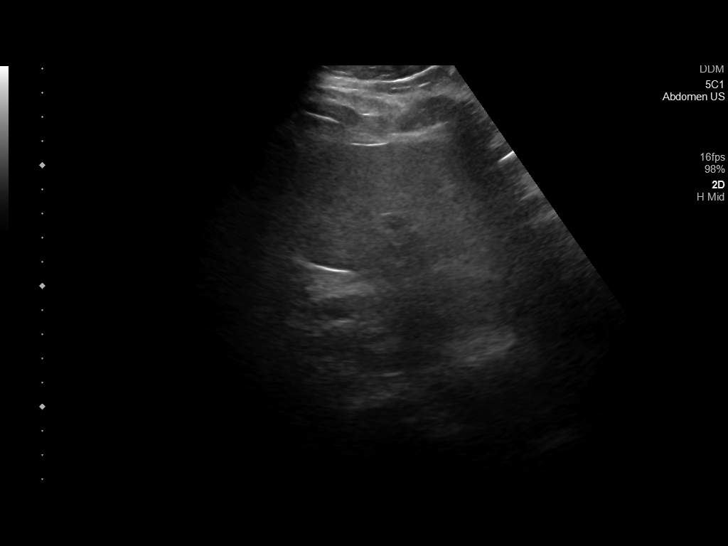
[im 26/39]
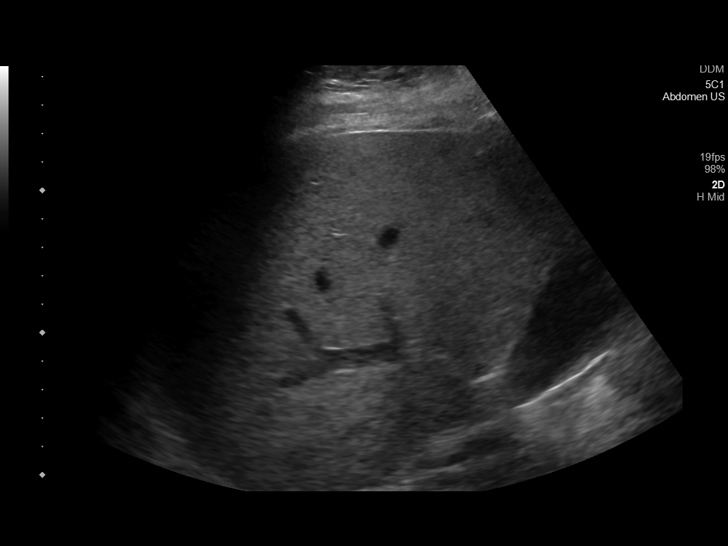
[im 29/39]
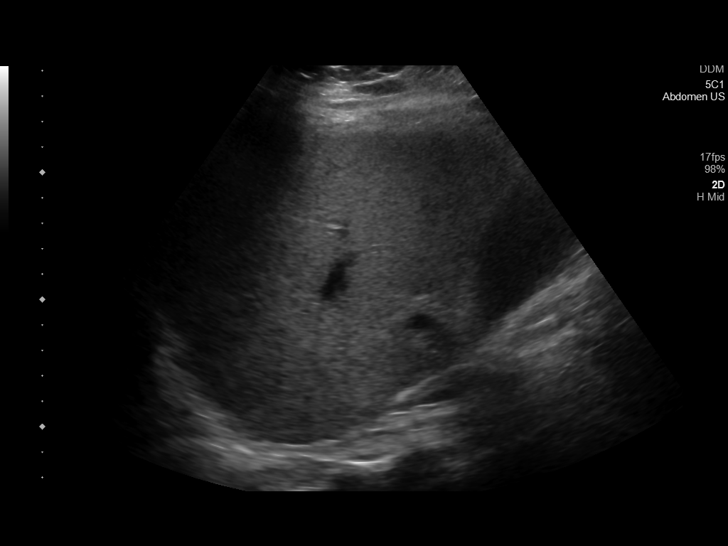
[im 32/39]
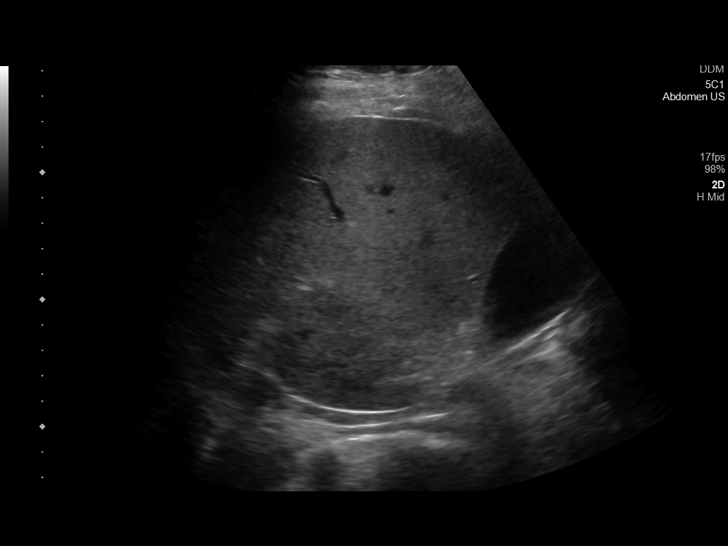
[im 35/39]
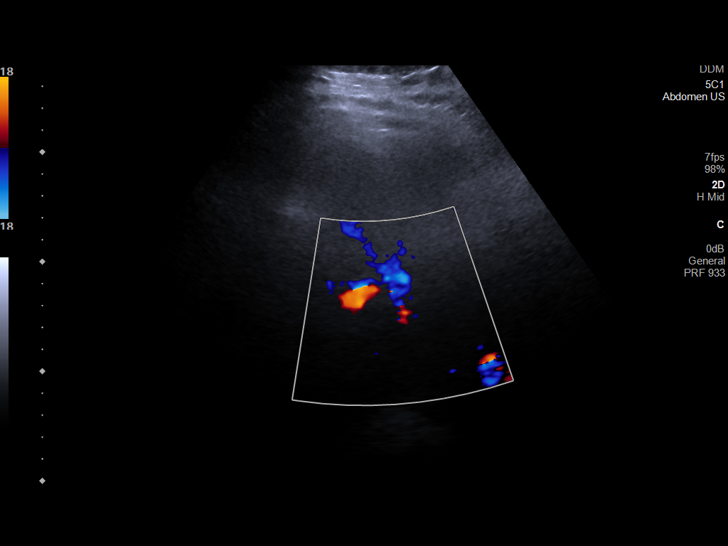
[im 39/39]
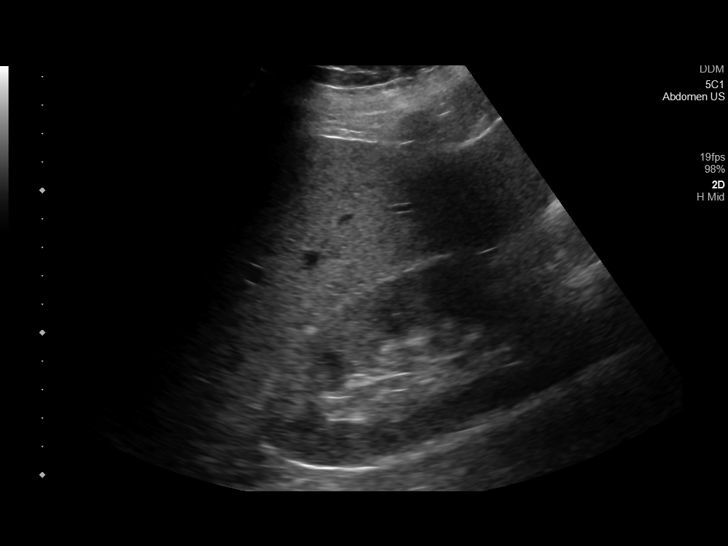

[14 of 25 positions shown; findings below may reference images not displayed]

FINDINGS: Gallbladder:

No gallstones or wall thickening visualized. No sonographic Murphy
sign noted by sonographer.

Common bile duct:

Diameter: 3 mm, normal.

Liver:

Mildly echogenic liver (image 40). No discrete liver lesion. No
intrahepatic biliary ductal dilatation. Portal vein is patent on
color Doppler imaging with normal direction of blood flow towards
the liver.

Other: Negative visible right kidney.
IMPRESSION: Evidence of hepatic steatosis, otherwise negative right upper
quadrant ultrasound.

## 2022-11-29 IMAGING — RF DG ESOPHAGUS
9 series · 14 of 24 positions shown · non-contrast
Comparison: None.

CLINICAL DATA: Epigastric pain and dysphagia

EXAM:
ESOPHOGRAM/BARIUM SWALLOW
TECHNIQUE: Single contrast examination was performed using  thin barium.
FLUOROSCOPY TIME:  Fluoroscopy Time:  1 minutes 48 seconds
Radiation Exposure Index (if provided by the fluoroscopic device):
30.7 mGy
Number of Acquired Spot Images: 3

[Series 1: cp_standard · 0.27mm/px · 1 of 1 slices shown (1 of 8)]
[im 1/1]
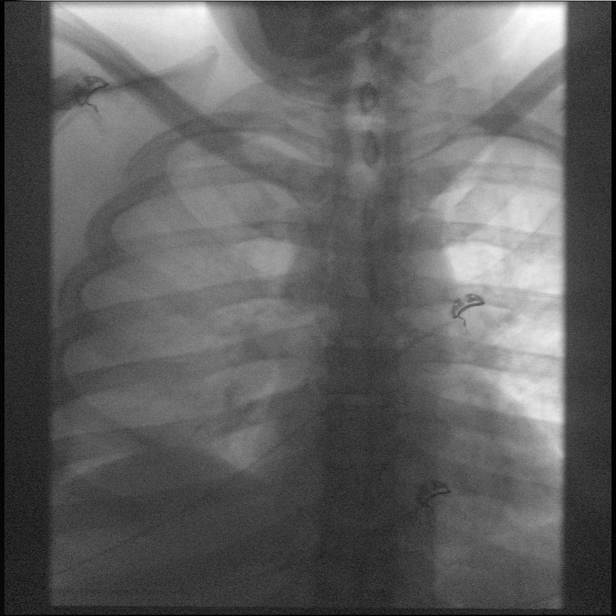

[Series 2: cp_standard · 0.27mm/px · 1 of 141 frames shown (2 of 8)]
[frame 71/141]
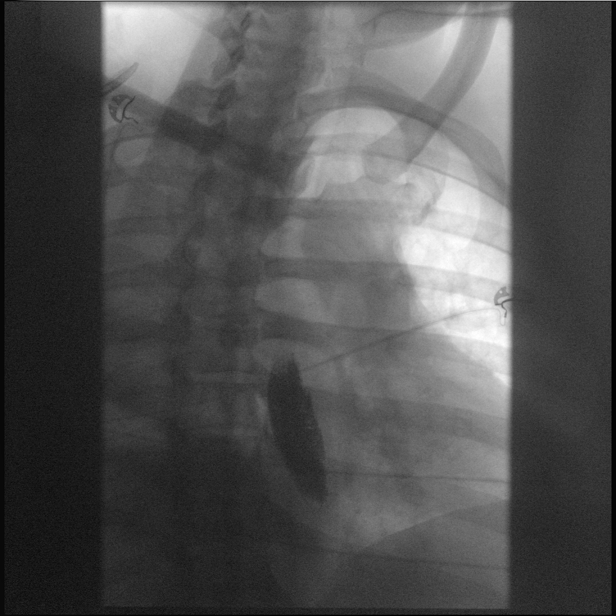

[Series 3: fluoro_barium 2fps_bw · 0.18mm/px · 1 of 3 frames shown]
[frame 1/3]
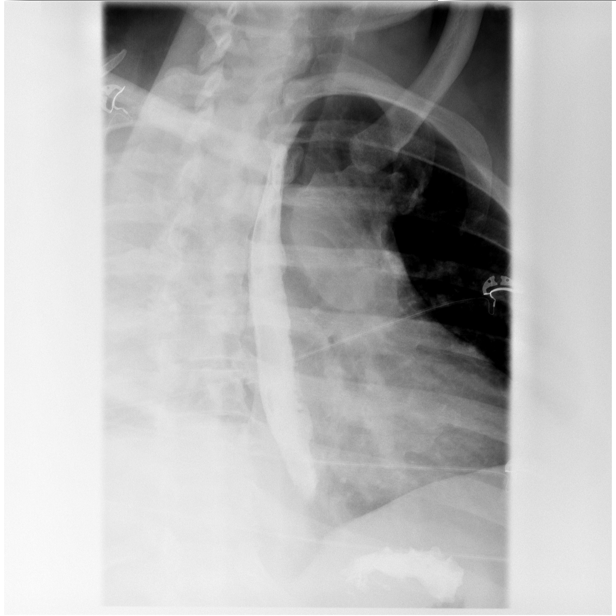

[Series 4: cp_standard · 0.27mm/px · 2 of 107 frames shown (3 of 8)]
[frame 17/107]
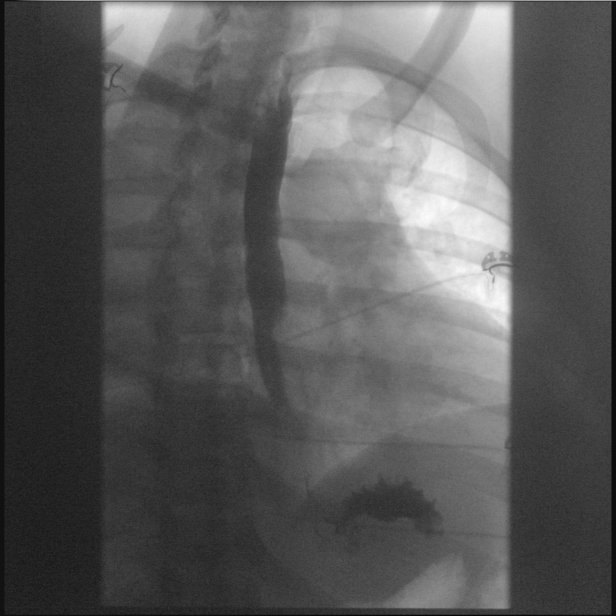
[frame 54/107]
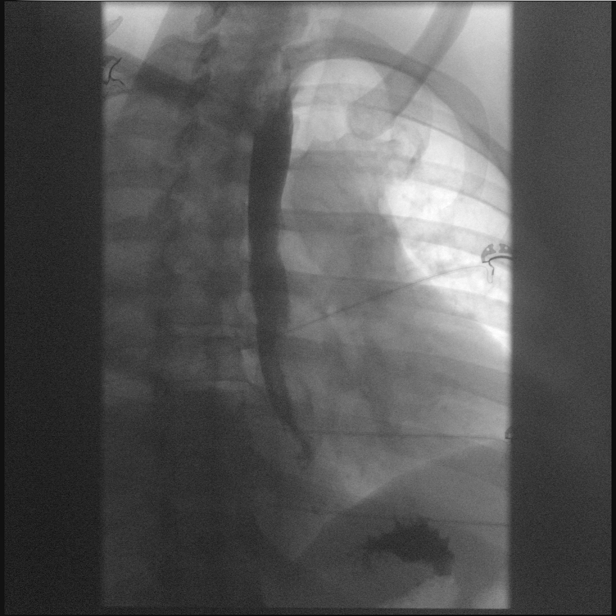

[Series 5: cp_standard · 0.27mm/px · 2 of 26 frames shown (4 of 8)]
[frame 4/26]
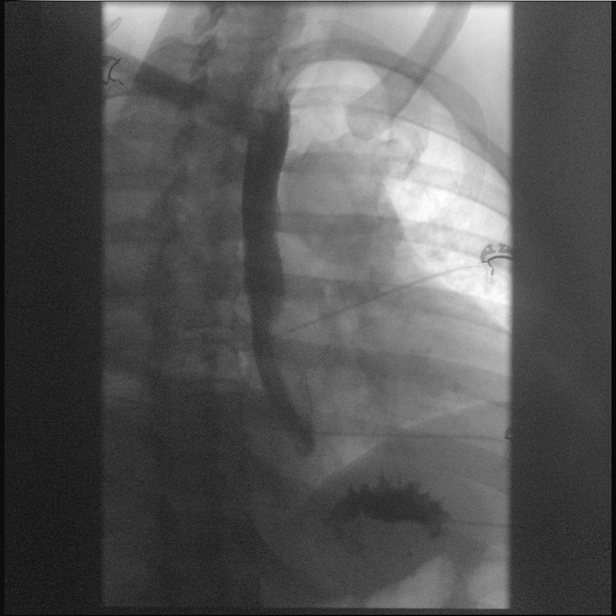
[frame 23/26]
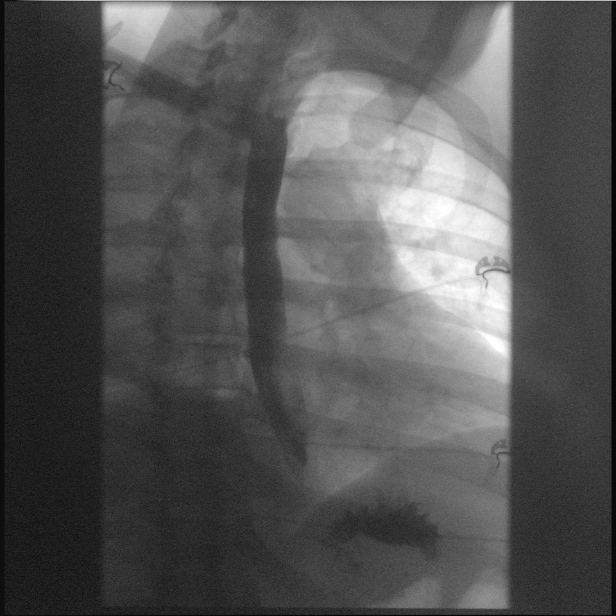

[Series 6: cp_standard · 0.27mm/px · 2 of 80 frames shown (5 of 8)]
[frame 13/80]
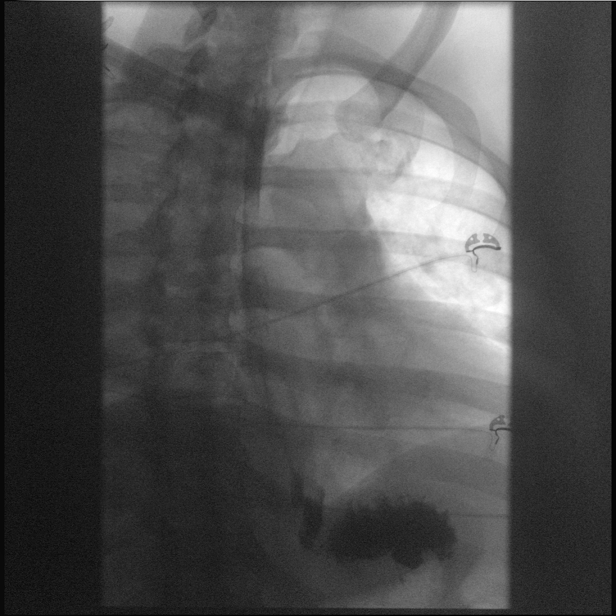
[frame 69/80]
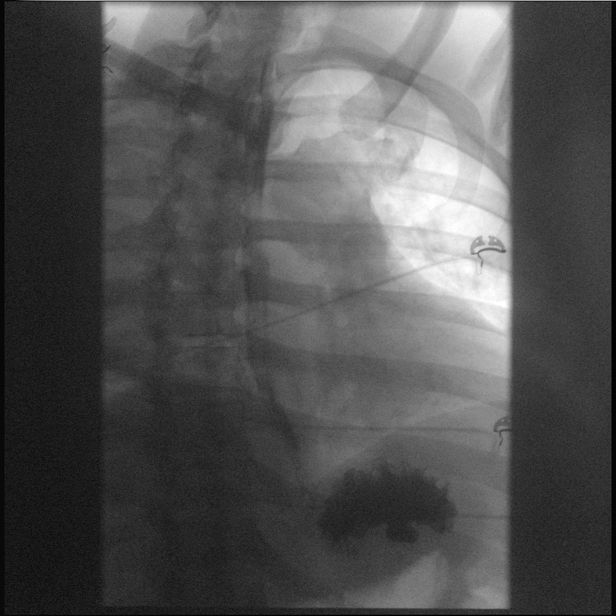

[Series 7: cp_standard · 0.27mm/px · 1 of 170 frames shown (6 of 8)]
[frame 86/170]
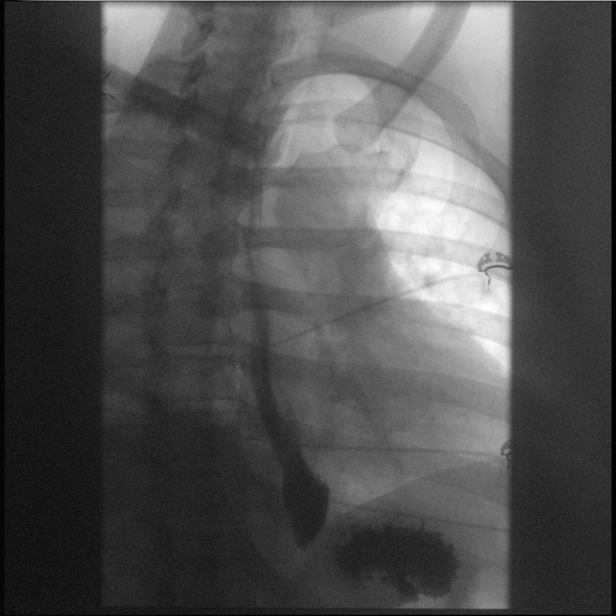

[Series 8: cp_standard · 0.27mm/px · 2 of 28 frames shown (7 of 8)]
[frame 5/28]
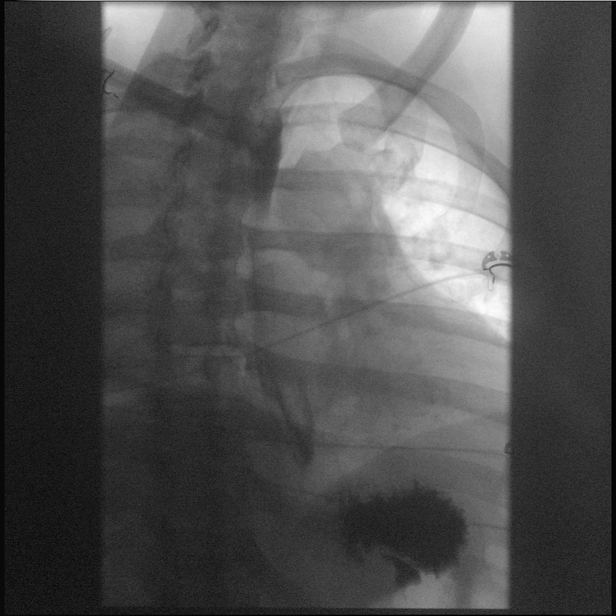
[frame 15/28]
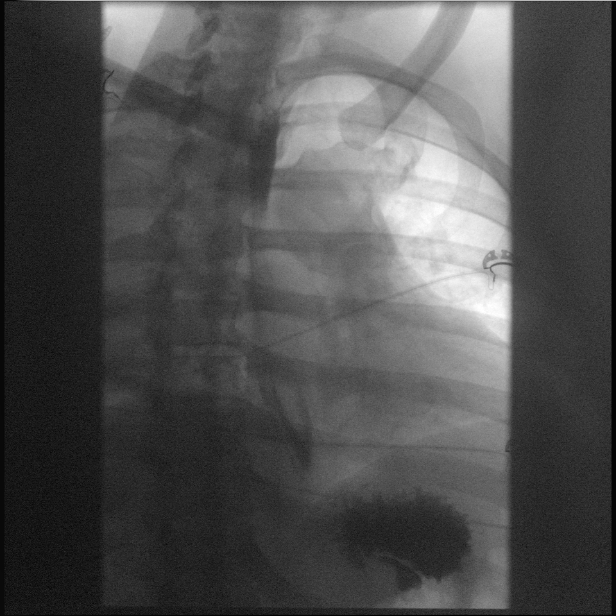

[Series 9: cp_standard · 0.27mm/px · 2 of 38 frames shown (8 of 8)]
[frame 6/38]
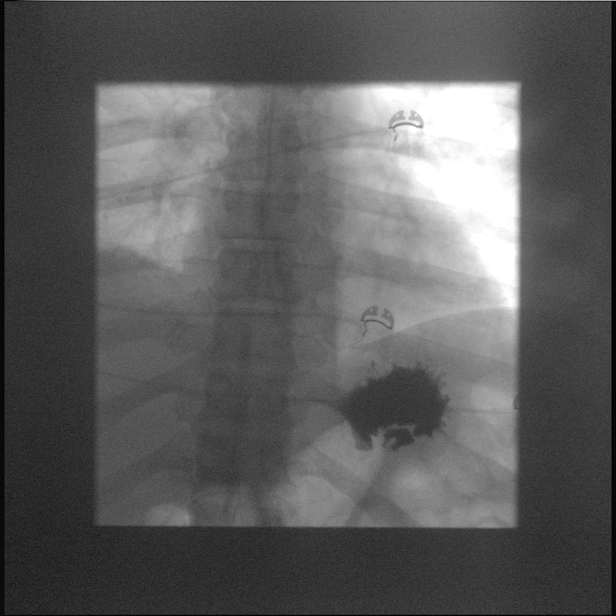
[frame 33/38]
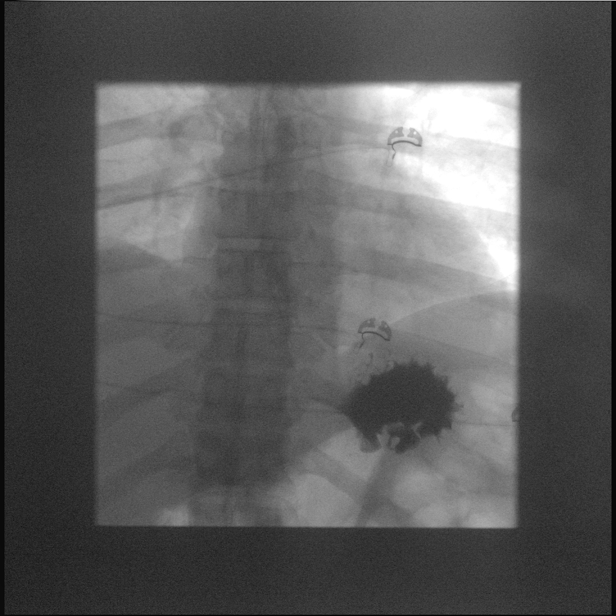

[14 of 24 positions shown; findings below may reference images not displayed]

FINDINGS: Limited supine LPO esophagram due to patient condition.

Mild esophageal dysmotility. No obstruction to the forward flow of
contrast throughout the esophagus and into the stomach.

There is evidence of a broad-based ulcer in the lower esophagus
measuring up to 2.7 cm in length.

Small sliding hiatal hernia. No gastroesophageal reflux occurred
during the exam.

The exam was stopped due to patient tolerance and condition. The
patient had severe burning sensation after swallowing the barium.
IMPRESSION: Evidence of a broad-based ulcer in the lower esophagus measuring up
to 2.7 cm. Recommend correlation with endoscopy.

Mild esophageal dysmotility. Small sliding-type hiatal hernia. No
gastroesophageal reflux occurred during the exam.

Limited esophagram due to patient condition. Patient had a severe
burning sensation after swallowing barium.
